# Patient Record
Sex: Female | Born: 1980
Health system: Southern US, Community
[De-identification: ages and names within clinical notes are randomized; demographics above are authoritative.]

## PROBLEM LIST (undated history)

## (undated) DIAGNOSIS — T783XXA Angioneurotic edema, initial encounter: Secondary | ICD-10-CM

## (undated) DIAGNOSIS — Z973 Presence of spectacles and contact lenses: Secondary | ICD-10-CM

## (undated) DIAGNOSIS — I10 Essential (primary) hypertension: Secondary | ICD-10-CM

## (undated) DIAGNOSIS — D573 Sickle-cell trait: Secondary | ICD-10-CM

## (undated) DIAGNOSIS — J329 Chronic sinusitis, unspecified: Secondary | ICD-10-CM

## (undated) DIAGNOSIS — J302 Other seasonal allergic rhinitis: Secondary | ICD-10-CM

## (undated) DIAGNOSIS — G8929 Other chronic pain: Secondary | ICD-10-CM

## (undated) DIAGNOSIS — N83201 Unspecified ovarian cyst, right side: Secondary | ICD-10-CM

## (undated) DIAGNOSIS — L509 Urticaria, unspecified: Secondary | ICD-10-CM

## (undated) HISTORY — DX: Chronic sinusitis, unspecified: J32.9

## (undated) HISTORY — DX: Angioneurotic edema, initial encounter: T78.3XXA

## (undated) HISTORY — DX: Urticaria, unspecified: L50.9

## (undated) HISTORY — PX: TONSILLECTOMY: SUR1361

---
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
## 2002-03-15 ENCOUNTER — Emergency Department (HOSPITAL_COMMUNITY): Admission: EM | Admit: 2002-03-15 | Discharge: 2002-03-15 | Payer: Self-pay | Admitting: Emergency Medicine

## 2012-10-31 ENCOUNTER — Emergency Department (HOSPITAL_BASED_OUTPATIENT_CLINIC_OR_DEPARTMENT_OTHER)
Admission: EM | Admit: 2012-10-31 | Discharge: 2012-10-31 | Disposition: A | Discharge status: Home / Self Care | Payer: No Typology Code available for payment source | Attending: Emergency Medicine | Admitting: Emergency Medicine

## 2012-10-31 ENCOUNTER — Encounter (HOSPITAL_BASED_OUTPATIENT_CLINIC_OR_DEPARTMENT_OTHER): Payer: Self-pay | Admitting: Emergency Medicine

## 2012-10-31 ENCOUNTER — Emergency Department (HOSPITAL_BASED_OUTPATIENT_CLINIC_OR_DEPARTMENT_OTHER): Payer: No Typology Code available for payment source

## 2012-10-31 DIAGNOSIS — S8990XA Unspecified injury of unspecified lower leg, initial encounter: Secondary | ICD-10-CM | POA: Insufficient documentation

## 2012-10-31 DIAGNOSIS — S79919A Unspecified injury of unspecified hip, initial encounter: Secondary | ICD-10-CM | POA: Insufficient documentation

## 2012-10-31 DIAGNOSIS — I10 Essential (primary) hypertension: Secondary | ICD-10-CM | POA: Insufficient documentation

## 2012-10-31 DIAGNOSIS — Y92410 Unspecified street and highway as the place of occurrence of the external cause: Secondary | ICD-10-CM | POA: Insufficient documentation

## 2012-10-31 DIAGNOSIS — Y9389 Activity, other specified: Secondary | ICD-10-CM | POA: Insufficient documentation

## 2012-10-31 DIAGNOSIS — IMO0002 Reserved for concepts with insufficient information to code with codable children: Secondary | ICD-10-CM | POA: Insufficient documentation

## 2012-10-31 DIAGNOSIS — Z79899 Other long term (current) drug therapy: Secondary | ICD-10-CM | POA: Insufficient documentation

## 2012-10-31 DIAGNOSIS — S0990XA Unspecified injury of head, initial encounter: Secondary | ICD-10-CM | POA: Insufficient documentation

## 2012-10-31 HISTORY — DX: Essential (primary) hypertension: I10

## 2012-10-31 LAB — PREGNANCY, URINE: Preg Test, Ur: NEGATIVE

## 2012-10-31 MED ORDER — HYDROCODONE-ACETAMINOPHEN 5-325 MG PO TABS: 1.0000 | ORAL_TABLET | Freq: Four times a day (QID) | ORAL | Status: DC | PRN

## 2012-10-31 MED ORDER — IBUPROFEN 800 MG PO TABS: 800.0000 mg | ORAL_TABLET | Freq: Three times a day (TID) | ORAL | Status: DC | PRN

## 2012-10-31 MED ORDER — HYDROCODONE-ACETAMINOPHEN 5-325 MG PO TABS
2.0000 | ORAL_TABLET | Freq: Once | ORAL | Status: AC
  Administered 2012-10-31: 2 via ORAL
  Filled 2012-10-31: qty 2

## 2012-10-31 MED ORDER — IBUPROFEN 800 MG PO TABS
800.0000 mg | ORAL_TABLET | Freq: Once | ORAL | Status: AC
  Administered 2012-10-31: 800 mg via ORAL
  Filled 2012-10-31: qty 1
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
## 2012-10-31 NOTE — ED Notes (Signed)
IV attempted x2- unsuccessful. 

## 2016-10-15 ENCOUNTER — Emergency Department (HOSPITAL_BASED_OUTPATIENT_CLINIC_OR_DEPARTMENT_OTHER)
Admission: EM | Admit: 2016-10-15 | Discharge: 2016-10-15 | Disposition: A | Discharge status: Home / Self Care | Payer: BC Managed Care – PPO | Attending: Emergency Medicine | Admitting: Emergency Medicine

## 2016-10-15 ENCOUNTER — Encounter (HOSPITAL_BASED_OUTPATIENT_CLINIC_OR_DEPARTMENT_OTHER): Payer: Self-pay | Admitting: *Deleted

## 2016-10-15 ENCOUNTER — Emergency Department (HOSPITAL_BASED_OUTPATIENT_CLINIC_OR_DEPARTMENT_OTHER): Payer: BC Managed Care – PPO

## 2016-10-15 DIAGNOSIS — I10 Essential (primary) hypertension: Secondary | ICD-10-CM | POA: Diagnosis not present

## 2016-10-15 DIAGNOSIS — N83202 Unspecified ovarian cyst, left side: Secondary | ICD-10-CM | POA: Insufficient documentation

## 2016-10-15 DIAGNOSIS — R109 Unspecified abdominal pain: Secondary | ICD-10-CM | POA: Diagnosis present

## 2016-10-15 DIAGNOSIS — N83201 Unspecified ovarian cyst, right side: Secondary | ICD-10-CM

## 2016-10-15 DIAGNOSIS — R112 Nausea with vomiting, unspecified: Secondary | ICD-10-CM | POA: Diagnosis not present

## 2016-10-15 DIAGNOSIS — K529 Noninfective gastroenteritis and colitis, unspecified: Secondary | ICD-10-CM | POA: Diagnosis not present

## 2016-10-15 DIAGNOSIS — Z79899 Other long term (current) drug therapy: Secondary | ICD-10-CM | POA: Diagnosis not present

## 2016-10-15 LAB — COMPREHENSIVE METABOLIC PANEL
ALT: 17 U/L (ref 14–54)
AST: 21 U/L (ref 15–41)
Albumin: 3.8 g/dL (ref 3.5–5.0)
Alkaline Phosphatase: 64 U/L (ref 38–126)
Anion gap: 8 (ref 5–15)
BUN: 10 mg/dL (ref 6–20)
CO2: 22 mmol/L (ref 22–32)
Calcium: 8.6 mg/dL — ABNORMAL LOW (ref 8.9–10.3)
Chloride: 106 mmol/L (ref 101–111)
Creatinine, Ser: 0.6 mg/dL (ref 0.44–1.00)
GFR calc Af Amer: >60 mL/min (ref >60)
Glucose, Bld: 115 mg/dL — ABNORMAL HIGH (ref 65–99)
Potassium: 3.3 mmol/L — ABNORMAL LOW (ref 3.5–5.1)
Sodium: 136 mmol/L (ref 135–145)
TOTAL PROTEIN: 7.3 g/dL (ref 6.5–8.1)
Total Bilirubin: 0.5 mg/dL (ref 0.3–1.2)

## 2016-10-15 LAB — CBC WITH DIFFERENTIAL/PLATELET
Basophils Absolute: 0 10*3/uL (ref 0.0–0.1)
Basophils Relative: 0 %
Eosinophils Absolute: 0 10*3/uL (ref 0.0–0.7)
Eosinophils Relative: 0 %
HCT: 35.3 % — ABNORMAL LOW (ref 36.0–46.0)
Hemoglobin: 11.9 g/dL — ABNORMAL LOW (ref 12.0–15.0)
Lymphocytes Relative: 4 %
Lymphs Abs: 0.6 10*3/uL — ABNORMAL LOW (ref 0.7–4.0)
MCH: 27.9 pg (ref 26.0–34.0)
MCHC: 33.7 g/dL (ref 30.0–36.0)
MCV: 82.7 fL (ref 78.0–100.0)
MONOS PCT: 4 %
Monocytes Absolute: 0.6 10*3/uL (ref 0.1–1.0)
Neutro Abs: 15 10*3/uL — ABNORMAL HIGH (ref 1.7–7.7)
Neutrophils Relative %: 92 %
PLATELETS: 293 10*3/uL (ref 150–400)
RBC: 4.27 MIL/uL (ref 3.87–5.11)
RDW: 13.5 % (ref 11.5–15.5)
WBC: 16.2 10*3/uL — ABNORMAL HIGH (ref 4.0–10.5)

## 2016-10-15 LAB — PREGNANCY, URINE: Preg Test, Ur: NEGATIVE

## 2016-10-15 LAB — WET PREP, GENITAL
CLUE CELLS WET PREP: NONE SEEN
SPERM: NONE SEEN
TRICH WET PREP: NONE SEEN
Yeast Wet Prep HPF POC: NONE SEEN

## 2016-10-15 LAB — URINALYSIS, ROUTINE W REFLEX MICROSCOPIC
BILIRUBIN URINE: NEGATIVE
Glucose, UA: NEGATIVE mg/dL
HGB URINE DIPSTICK: NEGATIVE
Ketones, ur: >80 mg/dL — AB
Leukocytes, UA: NEGATIVE
Nitrite: NEGATIVE
PROTEIN: NEGATIVE mg/dL
SPECIFIC GRAVITY, URINE: 1.01 (ref 1.005–1.030)
pH: 8 (ref 5.0–8.0)

## 2016-10-15 LAB — I-STAT CG4 LACTIC ACID, ED: LACTIC ACID, VENOUS: 1.05 mmol/L (ref 0.5–1.9)

## 2016-10-15 LAB — LIPASE, BLOOD: Lipase: 26 U/L (ref 11–51)

## 2016-10-15 MED ORDER — DICYCLOMINE HCL 10 MG/ML IM SOLN
20.0000 mg | Freq: Once | INTRAMUSCULAR | Status: AC
  Administered 2016-10-15: 20 mg via INTRAMUSCULAR
  Filled 2016-10-15: qty 2

## 2016-10-15 MED ORDER — PROMETHAZINE HCL 25 MG/ML IJ SOLN
25.0000 mg | Freq: Once | INTRAMUSCULAR | Status: AC
  Administered 2016-10-15: 25 mg via INTRAVENOUS
  Filled 2016-10-15: qty 1

## 2016-10-15 MED ORDER — HYDROMORPHONE HCL 1 MG/ML IJ SOLN
0.5000 mg | Freq: Once | INTRAMUSCULAR | Status: AC
  Administered 2016-10-15: 0.5 mg via INTRAVENOUS
  Filled 2016-10-15: qty 1

## 2016-10-15 MED ORDER — ONDANSETRON HCL 4 MG/2ML IJ SOLN
4.0000 mg | INTRAMUSCULAR | Status: AC
  Administered 2016-10-15: 4 mg via INTRAVENOUS
  Filled 2016-10-15: qty 2

## 2016-10-15 MED ORDER — FAMOTIDINE IN NACL 20-0.9 MG/50ML-% IV SOLN
20.0000 mg | Freq: Once | INTRAVENOUS | Status: AC
  Administered 2016-10-15: 20 mg via INTRAVENOUS
  Filled 2016-10-15: qty 50

## 2016-10-15 MED ORDER — ONDANSETRON HCL 4 MG/2ML IJ SOLN
4.0000 mg | Freq: Once | INTRAMUSCULAR | Status: AC
  Administered 2016-10-15: 4 mg via INTRAVENOUS
  Filled 2016-10-15: qty 2

## 2016-10-15 MED ORDER — SODIUM CHLORIDE 0.9 % IV BOLUS (SEPSIS)
1000.0000 mL | Freq: Once | INTRAVENOUS | Status: AC
  Administered 2016-10-15: 1000 mL via INTRAVENOUS

## 2016-10-15 MED ORDER — IOPAMIDOL (ISOVUE-300) INJECTION 61%
100.0000 mL | Freq: Once | INTRAVENOUS | Status: AC | PRN
  Administered 2016-10-15: 100 mL via INTRAVENOUS

## 2016-10-15 MED ORDER — HYDROCODONE-ACETAMINOPHEN 5-325 MG PO TABS: ORAL_TABLET | ORAL | 0 refills | Status: DC

## 2016-10-15 MED ORDER — ONDANSETRON HCL 4 MG PO TABS: 4.0000 mg | ORAL_TABLET | Freq: Three times a day (TID) | ORAL | 0 refills | Status: DC | PRN

## 2016-10-15 MED FILL — ONDANSETRON HCL 4 MG TABLET: 4 | 4 days supply | Qty: 10 | Fill #0

## 2016-10-15 MED FILL — HYDROCODON-APAP 5-325: 5-325 | 2 days supply | Qty: 13 | Fill #0

## 2016-10-15 NOTE — Discharge Instructions (Signed)
Take vicodin for breakthrough pain, do not drink alcohol, drive, care for children or do other critical tasks while taking vicodin.  Drink clear liquids over the course the next 2 days.  Please follow with your primary care doctor in the next 2 days for a check-up. They must obtain records for further management.   Do not hesitate to return to the Emergency Department for any new, worsening or concerning symptoms.

## 2016-10-15 NOTE — ED Notes (Signed)
PA at bedside.

## 2016-10-15 NOTE — ED Notes (Signed)
Pt c/o increased abdominal pressure. PA made aware. PA in to see patient at this time.

## 2016-10-15 NOTE — ED Triage Notes (Signed)
Pt reports n/v/d since 0300. Reports seeing blood in diarrhea in last 2 episodes

## 2016-10-15 NOTE — ED Notes (Signed)
Pt to CT

## 2016-10-15 NOTE — ED Notes (Signed)
IV attempt x 1 by this RN. Brett Canales, RT to attempt at this time.

## 2016-10-15 NOTE — ED Notes (Signed)
Pt c/o increased nausea at this time. PA made aware.

## 2016-10-15 NOTE — ED Provider Notes (Signed)
[facility]   CSN: [id number] Arrival date & time: 10/15/16  1039     History   Chief Complaint Chief Complaint  Patient presents with  . Abdominal Pain    HPI  Blood pressure 113/64, pulse (!) 102, temperature 98.2 F (36.8 C), temperature source Axillary, resp. rate 20, height  (1.6 m), weight 70.3 kg (155 lb), last menstrual period 09/20/2016, SpO2 100 %.  Carol Booth is a 36 y.o. female complaining of severe periumbilical abdominal pain onset at 3 AM it woke her from sleep she felt like she had to go to the bathroom and when she went to the restroom she vomited immediately. She then proceeded to have multiple episodes of nonbloody, nonbilious, non-coffee ground emesis with associated watery diarrhea, she states that towards the end the diarrhea became blood streaked and that frightened her. She doesn't have any sick contacts but she does work as a Midwife. She denies dysuria or hematuria abnormal vaginal discharge. She states the pain is 6 out of 10 and is suprapubic.  Past Medical History:  Diagnosis Date  . Hypertension     There are no active problems to display for this patient.   Past Surgical History:  Procedure Laterality Date  . TONSILLECTOMY      OB History    No data available       Home Medications    Prior to Admission medications   Medication Sig Start Date End Date Taking? Authorizing Provider  hydrochlorothiazide (HYDRODIURIL) 25 MG tablet Take 25 mg by mouth daily.   Yes [provider]  HYDROcodone-acetaminophen (NORCO/VICODIN) 5-325 MG tablet Take 1-2 tablets by mouth every 6 hours as needed for pain. 10/15/16   Davinci Glotfelty, Joni Reining, PA-C  ibuprofen (ADVIL,MOTRIN) 800 MG tablet Take 1 tablet (800 mg total) by mouth every 8 (eight) hours as needed for pain. 10/31/12   Elwin Mocha, MD  ondansetron (ZOFRAN) 4 MG tablet Take 1 tablet (4 mg total) by mouth every 8 (eight) hours as needed for  nausea or vomiting. 10/15/16   Samanth Mirkin, Mardella Layman    Family History No family history on file.  Social History Social History  Substance Use Topics  . Smoking status: Never Smoker  . Smokeless tobacco: Never Used  . Alcohol use Yes     Comment: social     Allergies   Septra [sulfamethoxazole-trimethoprim]   Review of Systems Review of Systems  A complete review of systems was obtained and all systems are negative except as noted in the HPI and PMH.    Physical Exam Updated Vital Signs BP 115/74 (BP Location: Right Arm)   Pulse 94   Temp 98.5 F (36.9 C) (Oral)   Resp 18   Ht  (1.6 m)   Wt 70.3 kg (155 lb)   LMP 09/20/2016   SpO2 97%   BMI 27.46 kg/m   Physical Exam  Constitutional: She is oriented to person, place, and time. She appears well-developed and well-nourished. No distress.  HENT:  Head: Normocephalic.  Mouth/Throat: Oropharynx is clear and moist.  Eyes: Conjunctivae and EOM are normal.  Neck: Normal range of motion.  Cardiovascular: Normal rate.   Pulmonary/Chest: Effort normal. No stridor.  Abdominal: Soft. Bowel sounds are normal. She exhibits no distension and no mass. There is no tenderness. There is no rebound and no guarding.  Mild suprapubic tenderness with no guarding or rebound, Rovsing negative, so is negative, obturator negative.  Genitourinary:  Genitourinary Comments: No  CVA tenderness to percussion bilaterally  Pelvic exam a chaperoned by technician: No rashes or lesions, no abnormal vaginal discharge, no cervical motion or adnexal tenderness.  Musculoskeletal: Normal range of motion.  Neurological: She is alert and oriented to person, place, and time.  Psychiatric: She has a normal mood and affect.  Nursing note and vitals reviewed.    ED Treatments / Results  Labs (all labs ordered are listed, but only abnormal results are displayed) Labs Reviewed  WET PREP, GENITAL - Abnormal; Notable for the following:        Result Value   WBC, Wet Prep HPF POC MODERATE (*)    All other components within normal limits  CBC WITH DIFFERENTIAL/PLATELET - Abnormal; Notable for the following:    WBC 16.2 (*)    Hemoglobin 11.9 (*)    HCT 35.3 (*)    Neutro Abs 15.0 (*)    Lymphs Abs 0.6 (*)    All other components within normal limits  URINALYSIS, ROUTINE W REFLEX MICROSCOPIC - Abnormal; Notable for the following:    Ketones, ur >80 (*)    All other components within normal limits  COMPREHENSIVE METABOLIC PANEL - Abnormal; Notable for the following:    Potassium 3.3 (*)    Glucose, Bld 115 (*)    Calcium 8.6 (*)    All other components within normal limits  PREGNANCY, URINE  LIPASE, BLOOD  I-STAT CG4 LACTIC ACID, ED  GC/CHLAMYDIA PROBE AMP ([place]) NOT AT [facility] ([place])    EKG  EKG Interpretation None       Radiology Ct Abdomen Pelvis W Contrast  Result Date: 10/15/2016 CLINICAL DATA:  Initial evaluation for acute nausea, vomiting, diarrhea. EXAM: CT ABDOMEN AND PELVIS WITH CONTRAST TECHNIQUE: Multidetector CT imaging of the abdomen and pelvis was performed using the standard protocol following bolus administration of intravenous contrast. CONTRAST:  ISOVUE-300 IOPAMIDOL (ISOVUE-300) INJECTION 61% COMPARISON:  Prior CT from 10/31/2012. FINDINGS: Lower chest: Minimal subsegmental atelectasis present within the visualized lung bases. Visualized lungs are otherwise clear. Hepatobiliary: Liver demonstrates a normal contrast enhanced appearance. Gallbladder within normal limits. No biliary dilatation. Pancreas: Pancreas within normal limits. Spleen: Spleen within normal limits. Adrenals/Urinary Tract: Adrenal glands are normal. Kidneys equal in size with symmetric enhancement. No nephrolithiasis, hydronephrosis, or focal enhancing renal mass. No hydroureter. Bladder within normal limits. Stomach/Bowel: Stomach within normal limits. No evidence for bowel obstruction. Appendix normal. Mild wall thickening with  hazy inflammatory stranding about the partially distended descending and sigmoid colon, suggesting acute colitis. Intraluminal fluid density within the distal colon consistent with concomitant diarrheal illness. No other acute inflammatory changes seen about the bowels. Vascular/Lymphatic: Normal intravascular enhancement seen throughout the intra-abdominal aorta and its branch vessels. 2.3 x 4.2 cm hypodense lesion seen at the left anterolateral [place] of the distal esophagus just above the GE junction (series 2, image 8). Additional 2.0 x 1.3 cm hypodense lesion seen at the right posterolateral aspect of the esophagus as well. These lesions may be contiguous with 1 another longer anterior aspect. Findings are indeterminate, but new relative to prior CT. No other pathologically enlarged lymph nodes identified within the abdomen and pelvis. Reproductive: Uterus within normal limits. 3.5 x 2.3 cm cystic lesion positioned near the left ovary, suspected to reflect a hemorrhagic cyst (series 2, image 68). Additional 4.6 cm right adnexal hypo dense lesion also suspected to reflect a hemorrhagic cyst as well. Subtle internal fluid fluid level may be present. Other: No free air or fluid. Small fat  containing paraumbilical hernia noted. Musculoskeletal: No acute osseus abnormality. No worrisome lytic or blastic osseous lesions. IMPRESSION: 1. Mild wall thickening with hazy soft tissue stranding about the descending/sigmoid colon, suggesting acute colitis. No complication identified. 2. Two cystic lesions measuring up to 4.2 cm adjacent to the GE junction, possibly contiguous with one another. Findings are indeterminate, but new relative to prior CT from 2014. Considerations include enlarged cystic paraesophageal lymph nodes, although no other adenopathy identified within the abdomen and pelvis. Possible fluid-filled esophageal diverticulum or duplication cyst could also be considered. Correlation with upper GI to evaluate  for communication with the distal esophageal lumen may be helpful for further evaluation. 3. Bilateral adnexal cysts measuring up to 4.6 cm as above, indeterminate, but suspected to reflect hemorrhagic ovarian cysts. A follow-up in ultrasound in 6-12 weeks recommended to ensure resolution. Electronically Signed   By: Rise Mu M.D.   On: 10/15/2016 15:28    Procedures Procedures (including critical care time)  Medications Ordered in ED Medications  ondansetron (ZOFRAN) injection 4 mg (4 mg Intravenous Given 10/15/16 1147)  sodium chloride 0.9 % bolus 1,000 mL (0 mLs Intravenous Stopped 10/15/16 1241)  famotidine (PEPCID) IVPB 20 mg premix (0 mg Intravenous Stopped 10/15/16 1316)  dicyclomine (BENTYL) injection 20 mg (20 mg Intramuscular Given 10/15/16 1241)  promethazine (PHENERGAN) injection 25 mg (25 mg Intravenous Given 10/15/16 1323)  sodium chloride 0.9 % bolus 1,000 mL (0 mLs Intravenous Stopped 10/15/16 1458)  HYDROmorphone (DILAUDID) injection 0.5 mg (0.5 mg Intravenous Given 10/15/16 1351)  ondansetron (ZOFRAN) injection 4 mg (4 mg Intravenous Given 10/15/16 1351)  iopamidol (ISOVUE-300) 61 % injection 100 mL (100 mLs Intravenous Contrast Given 10/15/16 1451)     Initial Impression / Assessment and Plan / ED Course  I have reviewed the triage vital signs and the nursing notes.  Pertinent labs & imaging results that were available during my care of the patient were reviewed by me and considered in my medical decision making (see chart for details).     Vitals:   10/15/16 1111 10/15/16 1323 10/15/16 1406 10/15/16 1406  BP:  108/60 115/74   Pulse:  84 94   Resp:  (!) 22 18   Temp:   98.7 F (37.1 C) 98.5 F (36.9 C)  TempSrc:   Oral Oral  SpO2:  100% 97%   Weight: 70.3 kg (155 lb)     Height:  (1.6 m)       Medications  ondansetron (ZOFRAN) injection 4 mg (4 mg Intravenous Given 10/15/16 1147)  sodium chloride 0.9 % bolus 1,000 mL (0 mLs Intravenous Stopped  10/15/16 1241)  famotidine (PEPCID) IVPB 20 mg premix (0 mg Intravenous Stopped 10/15/16 1316)  dicyclomine (BENTYL) injection 20 mg (20 mg Intramuscular Given 10/15/16 1241)  promethazine (PHENERGAN) injection 25 mg (25 mg Intravenous Given 10/15/16 1323)  sodium chloride 0.9 % bolus 1,000 mL (0 mLs Intravenous Stopped 10/15/16 1458)  HYDROmorphone (DILAUDID) injection 0.5 mg (0.5 mg Intravenous Given 10/15/16 1351)  ondansetron (ZOFRAN) injection 4 mg (4 mg Intravenous Given 10/15/16 1351)  iopamidol (ISOVUE-300) 61 % injection 100 mL (100 mLs Intravenous Contrast Given 10/15/16 1451)    Nimrat Heckert is 36 y.o. female presenting with Acute onset of abdominal pain, nausea and vomiting. The diarrhea became blood-streaked and that frightened her. Patient with a benign abdominal exam, she does have some mild suprapubic tenderness. Will check basic blood work, hydrate and give Zofran in addition to Bentyl and Pepcid.  Patient with persistent  abdominal pain, given her leukocytosis of 16 will obtain CT abdomen pelvis, pelvic exam unremarkable. No signs of PID.  CT abdomen pelvis Reveals colitis with bilateral ovarian cysts, likely hemorrhagic (she is not anemic) they also note 2 cystic lesions adjacent to the GE junction they are new from a prior CT in 2014. This could be enlarged cystic paraesophageal lymph nodes there is no other lymphadenopathy or a possible esophageal diverticulum. Advised patient finding in that she will need to follow closely with both primary care and gastroenterology. Patient verbalized understanding. Advised her to drink clears and we've had an extensive discussion of return precautions.  Discussed case with attending physician who agrees with care plan and disposition.   Evaluation does not show pathology that would require ongoing emergent intervention or inpatient treatment. Pt is hemodynamically stable and mentating appropriately. Discussed findings and plan with  patient/guardian, who agrees with care plan. All questions answered. Return precautions discussed and outpatient follow up given.      Final Clinical Impressions(s) / ED Diagnoses   Final diagnoses:  Colitis  Bilateral ovarian cysts    New Prescriptions New Prescriptions   HYDROCODONE-ACETAMINOPHEN (NORCO/VICODIN) 5-325 MG TABLET    Take 1-2 tablets by mouth every 6 hours as needed for pain.   ONDANSETRON (ZOFRAN) 4 MG TABLET    Take 1 tablet (4 mg total) by mouth every 8 (eight) hours as needed for nausea or vomiting.     Kaylyn Lim 10/15/16 1604    Vanetta Mulders, MD 10/16/16 [phone]

## 2016-10-16 LAB — GC/CHLAMYDIA PROBE AMP (~~LOC~~) NOT AT ARMC
Chlamydia: NEGATIVE
Neisseria Gonorrhea: NEGATIVE

## 2016-11-27 ENCOUNTER — Encounter: Payer: Self-pay | Admitting: Gastroenterology

## 2017-01-26 ENCOUNTER — Ambulatory Visit: Payer: BC Managed Care – PPO | Admitting: Gastroenterology

## 2017-02-03 ENCOUNTER — Ambulatory Visit: Payer: BC Managed Care – PPO | Admitting: Gastroenterology

## 2018-09-04 IMAGING — CT CT ABD-PELV W/ CM
2 of 4 series · 15 of 46 positions shown, 17 images · IV contrast (APPLIED)
Comparison: Prior CT from 10/31/2012.

CLINICAL DATA: Initial evaluation for acute nausea, vomiting,
diarrhea.

EXAM:
CT ABDOMEN AND PELVIS WITH CONTRAST
TECHNIQUE: Multidetector CT imaging of the abdomen and pelvis was performed
using the standard protocol following bolus administration of
intravenous contrast.
CONTRAST:  100mL 05Q8W7-RKK IOPAMIDOL ([id number]-RKK) INJECTION 61%

[Series 2: axial st · axial · 0.96mm/px · z∈[-454,-50]mm · 12 of 97 slices shown, 14 images]
[im 8/97  soft-tissue]
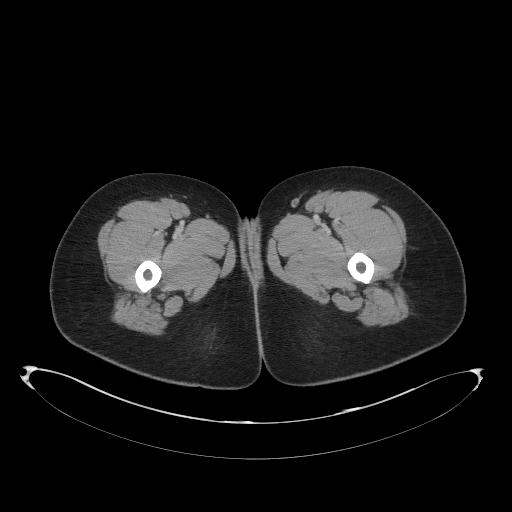
[im 8/97  bone]
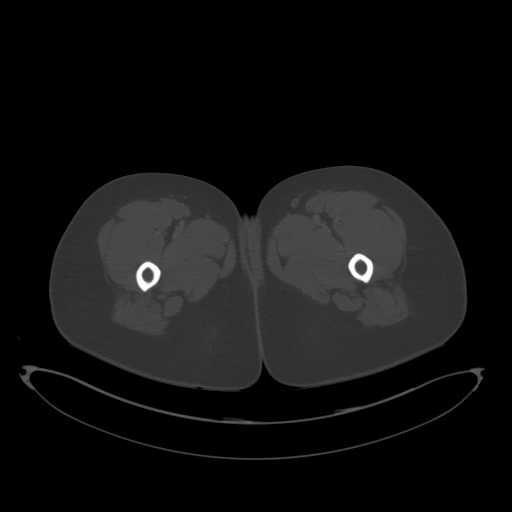
[im 15/97  soft-tissue]
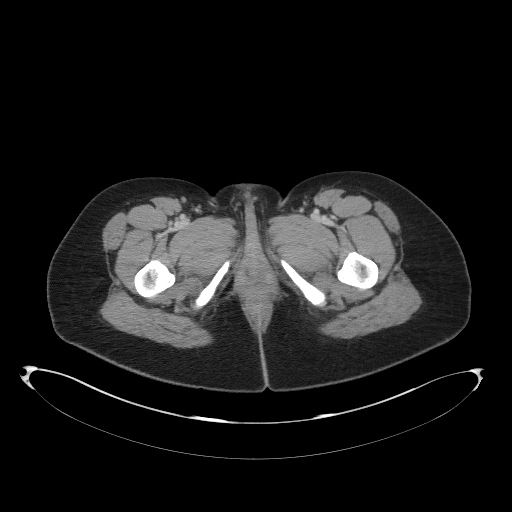
[im 23/97  soft-tissue]
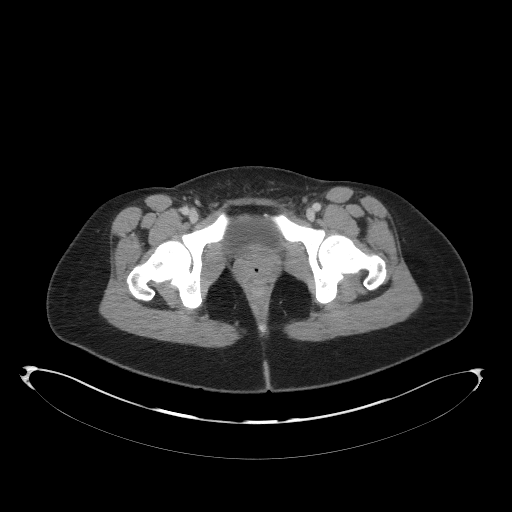
[im 30/97  soft-tissue]
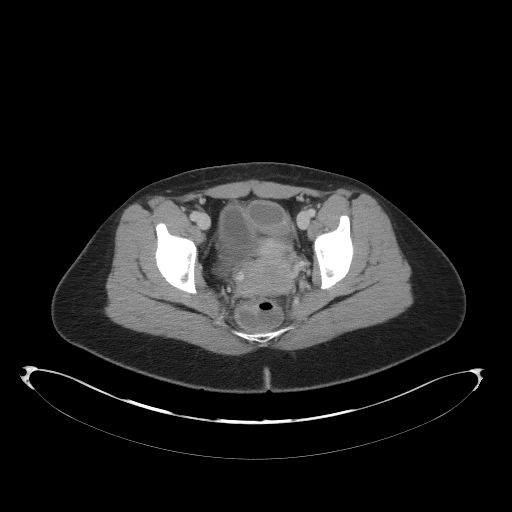
[im 37/97  soft-tissue]
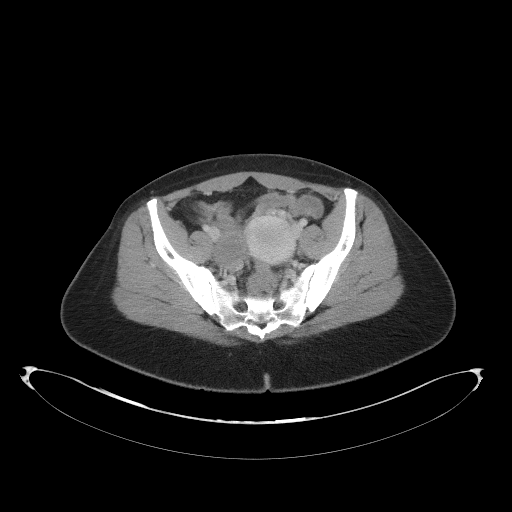
[im 45/97  soft-tissue]
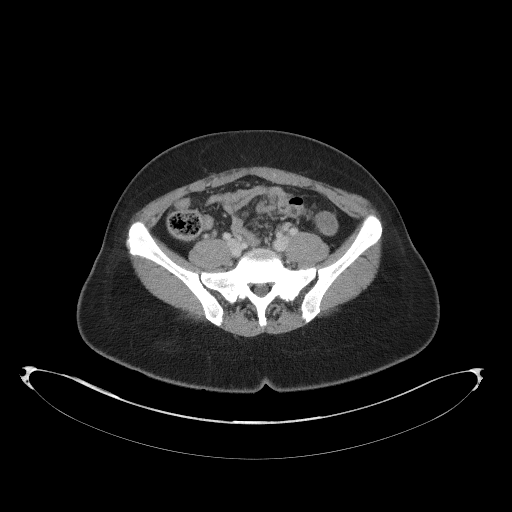
[im 52/97  soft-tissue]
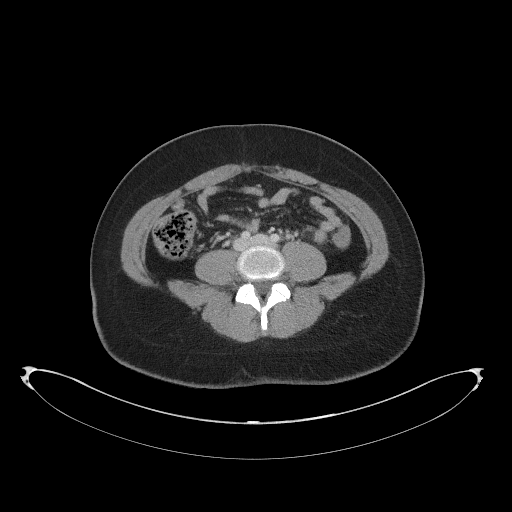
[im 60/97  soft-tissue]
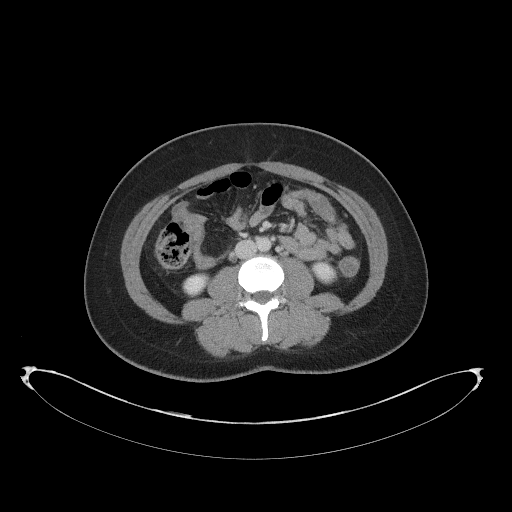
[im 67/97  soft-tissue]
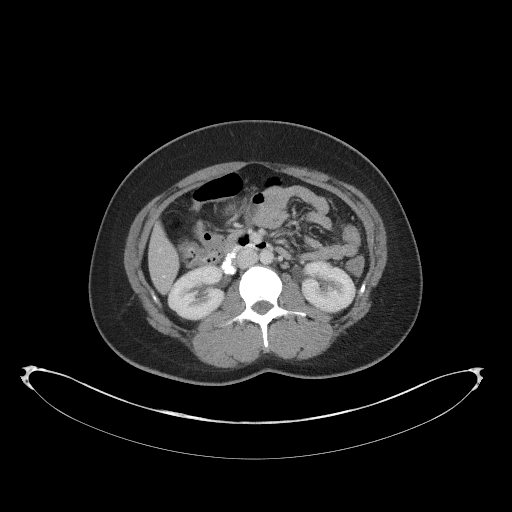
[im 67/97  bone]
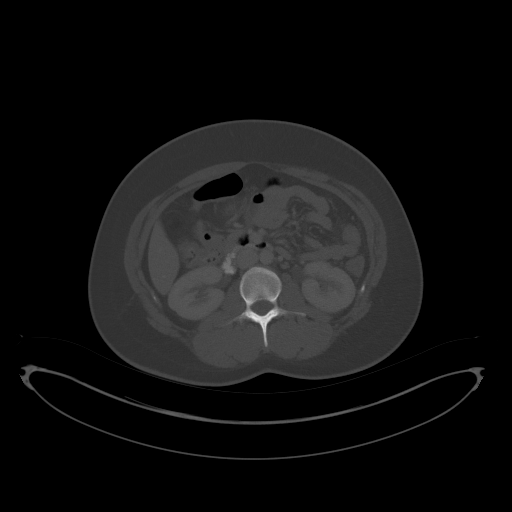
[im 74/97  soft-tissue]
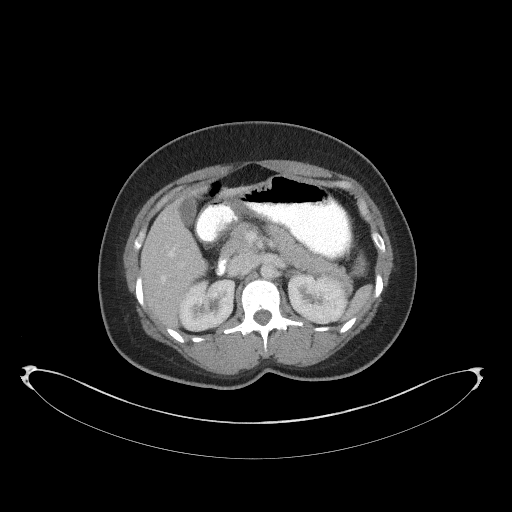
[im 82/97  soft-tissue]
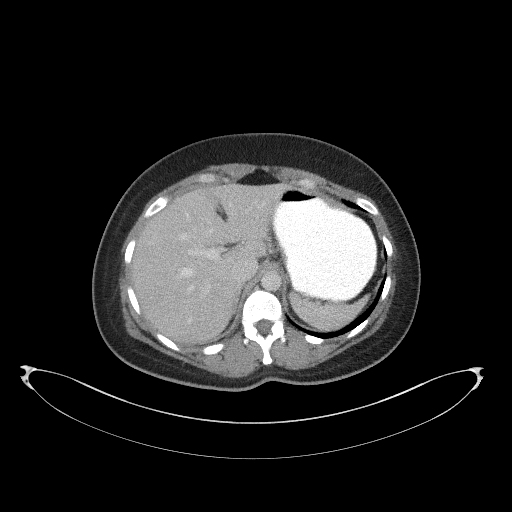
[im 89/97  soft-tissue]
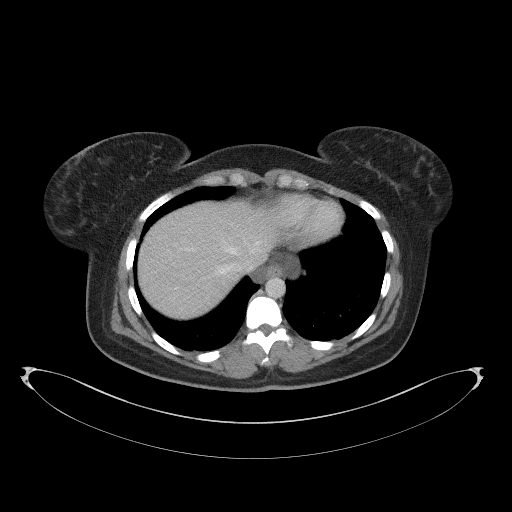

[Series 5: coronal st · coronal · 0.80mm/px · 3 of 90 slices shown]
[im 30/90  soft-tissue]
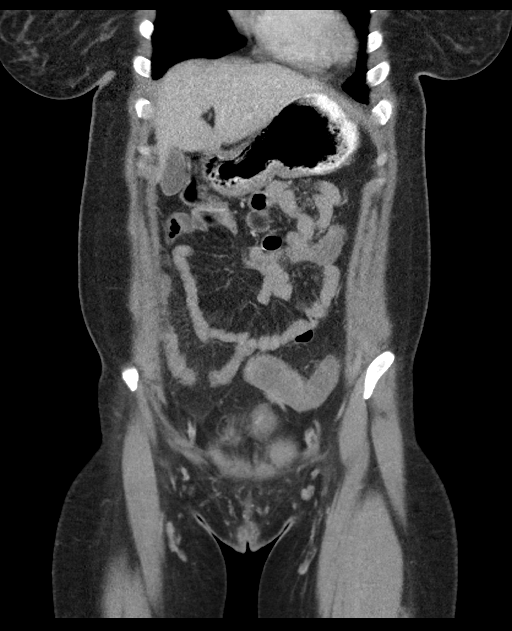
[im 40/90  soft-tissue]
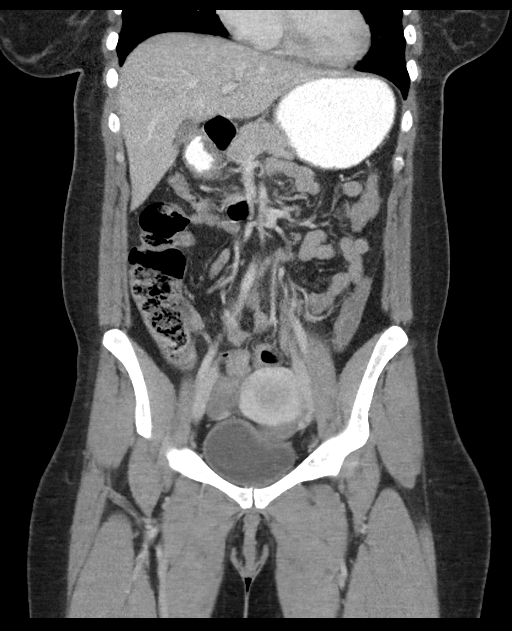
[im 50/90  soft-tissue]
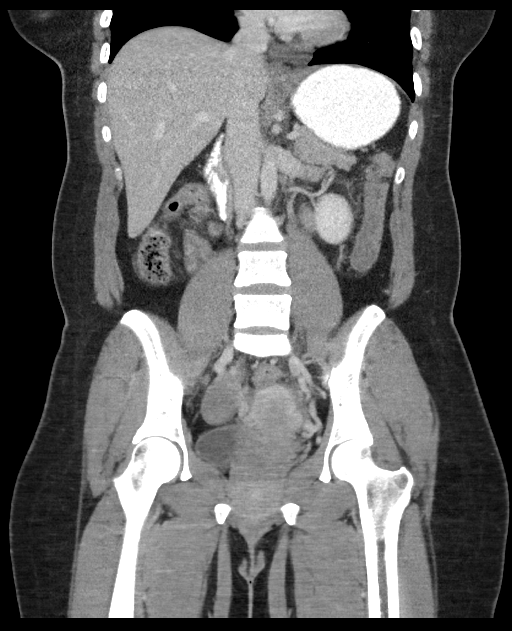

[15 of 46 positions shown; findings below may reference images not displayed]

FINDINGS: Lower chest: Minimal subsegmental atelectasis present within the
visualized lung bases. Visualized lungs are otherwise clear.

Hepatobiliary: Liver demonstrates a normal contrast enhanced
appearance. Gallbladder within normal limits. No biliary dilatation.

Pancreas: Pancreas within normal limits.

Spleen: Spleen within normal limits.

Adrenals/Urinary Tract: Adrenal glands are normal. Kidneys equal in
size with symmetric enhancement. No nephrolithiasis, hydronephrosis,
or focal enhancing renal mass. No hydroureter. Bladder within normal
limits.

Stomach/Bowel: Stomach within normal limits. No evidence for bowel
obstruction. Appendix normal. Mild wall thickening with hazy
inflammatory stranding about the partially distended descending and
sigmoid colon, suggesting acute colitis. Intraluminal fluid density
within the distal colon consistent with concomitant diarrheal
illness. No other acute inflammatory changes seen about the bowels.

Vascular/Lymphatic: Normal intravascular enhancement seen throughout
the intra-abdominal aorta and its branch vessels.

2.3 x 4.2 cm hypodense lesion seen at the left anterolateral
[place] of the distal esophagus just above the GE junction (series
2, image 8). Additional 2.0 x 1.3 cm hypodense lesion seen at the
right posterolateral aspect of the esophagus as well. These lesions
may be contiguous with 1 another longer anterior aspect. Findings
are indeterminate, but new relative to prior CT.

No other pathologically enlarged lymph nodes identified within the
abdomen and pelvis.

Reproductive: Uterus within normal limits. 3.5 x 2.3 cm cystic
lesion positioned near the left ovary, suspected to reflect a
hemorrhagic cyst (series 2, image 68). Additional 4.6 cm right
adnexal hypo dense lesion also suspected to reflect a hemorrhagic
cyst as well. Subtle internal fluid fluid level may be present.

Other: No free air or fluid. Small fat containing paraumbilical
hernia noted.

Musculoskeletal: No acute osseus abnormality. No worrisome lytic or
blastic osseous lesions.
IMPRESSION: 1. Mild wall thickening with hazy soft tissue stranding about the
descending/sigmoid colon, suggesting acute colitis. No complication
identified.
2. Two cystic lesions measuring up to 4.2 cm adjacent to the GE
junction, possibly contiguous with one another. Findings are
indeterminate, but new relative to prior CT from 0775.
Considerations include enlarged cystic paraesophageal lymph nodes,
although no other adenopathy identified within the abdomen and
pelvis. Possible fluid-filled esophageal diverticulum or duplication
cyst could also be considered. Correlation with upper GI to evaluate
for communication with the distal esophageal lumen may be helpful
for further evaluation.
3. Bilateral adnexal cysts measuring up to 4.6 cm as above,
indeterminate, but suspected to reflect hemorrhagic ovarian cysts. A
follow-up in ultrasound in 6-12 weeks recommended to ensure
resolution.

## 2019-01-17 ENCOUNTER — Ambulatory Visit: Payer: BC Managed Care – PPO | Attending: Internal Medicine

## 2019-01-17 DIAGNOSIS — Z20822 Contact with and (suspected) exposure to covid-19: Secondary | ICD-10-CM

## 2019-01-19 LAB — NOVEL CORONAVIRUS, NAA: SARS-CoV-2, NAA: NOT DETECTED

## 2020-02-07 DIAGNOSIS — Z8616 Personal history of COVID-19: Secondary | ICD-10-CM

## 2020-02-07 HISTORY — DX: Personal history of COVID-19: Z86.16

## 2020-02-16 ENCOUNTER — Other Ambulatory Visit: Payer: Self-pay

## 2020-02-16 ENCOUNTER — Emergency Department (HOSPITAL_COMMUNITY)
Admission: EM | Admit: 2020-02-16 | Discharge: 2020-02-16 | Disposition: A | Discharge status: Home / Self Care | Payer: BC Managed Care – PPO | Attending: Emergency Medicine | Admitting: Emergency Medicine

## 2020-02-16 DIAGNOSIS — Z8616 Personal history of COVID-19: Secondary | ICD-10-CM | POA: Diagnosis not present

## 2020-02-16 DIAGNOSIS — I10 Essential (primary) hypertension: Secondary | ICD-10-CM | POA: Insufficient documentation

## 2020-02-16 DIAGNOSIS — R220 Localized swelling, mass and lump, head: Secondary | ICD-10-CM | POA: Diagnosis present

## 2020-02-16 DIAGNOSIS — Z79899 Other long term (current) drug therapy: Secondary | ICD-10-CM | POA: Diagnosis not present

## 2020-02-16 DIAGNOSIS — J029 Acute pharyngitis, unspecified: Secondary | ICD-10-CM | POA: Diagnosis not present

## 2020-02-16 DIAGNOSIS — L509 Urticaria, unspecified: Secondary | ICD-10-CM | POA: Diagnosis not present

## 2020-02-16 DIAGNOSIS — K130 Diseases of lips: Secondary | ICD-10-CM | POA: Diagnosis not present

## 2020-02-16 LAB — BASIC METABOLIC PANEL
Anion gap: 12 (ref 5–15)
BUN: 10 mg/dL (ref 6–20)
CO2: 28 mmol/L (ref 22–32)
Calcium: 9.4 mg/dL (ref 8.9–10.3)
Chloride: 99 mmol/L (ref 98–111)
Creatinine, Ser: 0.91 mg/dL (ref 0.44–1.00)
GFR, Estimated: >60 mL/min (ref >60)
Glucose, Bld: 121 mg/dL — ABNORMAL HIGH (ref 70–99)
Potassium: 3.4 mmol/L — ABNORMAL LOW (ref 3.5–5.1)
Sodium: 139 mmol/L (ref 135–145)

## 2020-02-16 LAB — CBC WITH DIFFERENTIAL/PLATELET
Abs Immature Granulocytes: 0.02 10*3/uL (ref 0.00–0.07)
Basophils Absolute: 0 10*3/uL (ref 0.0–0.1)
Basophils Relative: 0 %
Eosinophils Absolute: 0.1 10*3/uL (ref 0.0–0.5)
Eosinophils Relative: 1 %
HCT: 36.3 % (ref 36.0–46.0)
Hemoglobin: 11.9 g/dL — ABNORMAL LOW (ref 12.0–15.0)
Immature Granulocytes: 0 %
Lymphocytes Relative: 28 %
Lymphs Abs: 2.5 10*3/uL (ref 0.7–4.0)
MCH: 26.7 pg (ref 26.0–34.0)
MCHC: 32.8 g/dL (ref 30.0–36.0)
MCV: 81.6 fL (ref 80.0–100.0)
Monocytes Absolute: 0.7 10*3/uL (ref 0.1–1.0)
Monocytes Relative: 8 %
Neutro Abs: 5.5 10*3/uL (ref 1.7–7.7)
Neutrophils Relative %: 63 %
Platelets: 430 10*3/uL — ABNORMAL HIGH (ref 150–400)
RBC: 4.45 MIL/uL (ref 3.87–5.11)
RDW: 15.6 % — ABNORMAL HIGH (ref 11.5–15.5)
WBC: 8.8 10*3/uL (ref 4.0–10.5)
nRBC: 0 % (ref 0.0–0.2)

## 2020-02-16 MED ORDER — PREDNISONE 20 MG PO TABS: 40.0000 mg | ORAL_TABLET | Freq: Every day | ORAL | 0 refills | Status: DC

## 2020-02-16 MED ORDER — EPINEPHRINE 0.3 MG/0.3ML IJ SOAJ: 0.3000 mg | Freq: Once | INTRAMUSCULAR | 1 refills | Status: AC | PRN

## 2020-02-16 MED ORDER — DEXAMETHASONE SODIUM PHOSPHATE 10 MG/ML IJ SOLN
10.0000 mg | Freq: Once | INTRAMUSCULAR | Status: AC
  Administered 2020-02-16: 10 mg via INTRAMUSCULAR
  Filled 2020-02-16: qty 1

## 2020-02-16 NOTE — ED Provider Notes (Signed)
[facility] Note   CSN: [id number] Arrival date & time: 02/16/20  1822     History Chief Complaint  Patient presents with  . Oral Swelling  . Sore Throat    Carol Booth is a 40 y.o. female.  Patient presents to the emergency department with a chief complaint of lip swelling.  She was diagnosed with Covid on 1/14.  She was given a 5-day course of prednisone and reports that she had some improvement with this.  She states that she continues to have intermittent lip swelling and hives.  She also states the last night it felt like she had some throat swelling.  She denies wheezing or vomiting.  Denies any new medications.  She takes HCTZ and birth control.  She denies any new food or allergic contacts.  She has been having the symptoms for about a week.  The history is provided by the patient. No language interpreter was used.       Past Medical History:  Diagnosis Date  . Hypertension     There are no problems to display for this patient.   Past Surgical History:  Procedure Laterality Date  . TONSILLECTOMY       OB History   No obstetric history on file.     No family history on file.  Social History   Tobacco Use  . Smoking status: Never Smoker  . Smokeless tobacco: Never Used  Vaping Use  . Vaping Use: Never used  Substance Use Topics  . Alcohol use: Yes    Comment: social  . Drug use: No    Home Medications Prior to Admission medications   Medication Sig Start Date End Date Taking? Authorizing Provider  hydrochlorothiazide (HYDRODIURIL) 25 MG tablet Take 25 mg by mouth daily.    [provider]  HYDROcodone-acetaminophen (NORCO/VICODIN) 5-325 MG tablet Take 1-2 tablets by mouth every 6 hours as needed for pain. 10/15/16   Pisciotta, Joni Reining, PA-C  ibuprofen (ADVIL,MOTRIN) 800 MG tablet Take 1 tablet (800 mg total) by mouth every 8 (eight) hours as needed for pain. 10/31/12   Elwin Mocha, MD   ondansetron (ZOFRAN) 4 MG tablet Take 1 tablet (4 mg total) by mouth every 8 (eight) hours as needed for nausea or vomiting. 10/15/16   Pisciotta, Joni Reining, PA-C    Allergies    Septra [sulfamethoxazole-trimethoprim]  Review of Systems   Review of Systems  All other systems reviewed and are negative.   Physical Exam Updated Vital Signs BP (!) 141/94   Pulse (!) 109   Temp 99.4 F (37.4 C)   Resp 18   Ht 5\' 3"  (1.6 m)   Wt 73 kg   LMP 01/14/2020 (Within Days)   SpO2 99%   BMI 28.52 kg/m   Physical Exam Vitals and nursing note reviewed.  Constitutional:      General: She is not in acute distress.    Appearance: She is well-developed and well-nourished.  HENT:     Head: Normocephalic and atraumatic.     Mouth/Throat:     Comments: Oropharynx is clear, no significant swelling, no stridor, normal phonation, mildly swollen lower lip Eyes:     Conjunctiva/sclera: Conjunctivae normal.  Cardiovascular:     Rate and Rhythm: Normal rate and regular rhythm.     Heart sounds: No murmur heard.   Pulmonary:     Effort: Pulmonary effort is normal. No respiratory distress.     Breath sounds: Normal breath sounds.  Comments: Lungs are clear to auscultation, no wheezing Abdominal:     Palpations: Abdomen is soft.     Tenderness: There is no abdominal tenderness.  Musculoskeletal:        General: No edema. Normal range of motion.     Cervical back: Neck supple.  Skin:    General: Skin is warm and dry.     Comments: Scattered mild hives  Neurological:     Mental Status: She is alert and oriented to person, place, and time.  Psychiatric:        Mood and Affect: Mood and affect and mood normal.        Behavior: Behavior normal.     ED Results / Procedures / Treatments   Labs (all labs ordered are listed, but only abnormal results are displayed) Labs Reviewed  CBC WITH DIFFERENTIAL/PLATELET - Abnormal; Notable for the following components:      Result Value   Hemoglobin  11.9 (*)    RDW 15.6 (*)    Platelets 430 (*)    All other components within normal limits  BASIC METABOLIC PANEL - Abnormal; Notable for the following components:   Potassium 3.4 (*)    Glucose, Bld 121 (*)    All other components within normal limits    EKG None  Radiology No results found.  Procedures Procedures   Medications Ordered in ED Medications  dexamethasone (DECADRON) injection 10 mg (has no administration in time range)    ED Course  I have reviewed the triage vital signs and the nursing notes.  Pertinent labs & imaging results that were available during my care of the patient were reviewed by me and considered in my medical decision making (see chart for details).    MDM Rules/Calculators/A&P                          Patient with hives, will and lip swelling.  She also reports having had some throat swelling last night.  She reports having had improvement of her symptoms while she was taking prednisone.  She only took this for 5 days.  I think that it would be reasonable to put her on a prednisone taper and give her a dose of Decadron now.  I will also send the patient with an EpiPen.  At this time, with very reassuring exam, and noting that the patient has been having the symptoms for about a week, I do not think that she requires admission or further observation in the hospital.  Will give referral to allergy.  Return precautions discussed.  Patient understands and agrees with this plan.   Final Clinical Impression(s) / ED Diagnoses Final diagnoses:  Hives  Lip swelling    Rx / DC Orders ED Discharge Orders    None       Roxy Horseman, PA-C 02/16/20 2237    Cathren Laine, MD 02/16/20 [phone]

## 2020-02-16 NOTE — Discharge Instructions (Signed)
I recommend taking any antihistamine such as zyrtec or claritin daily.  Use the epipen if you have shortness of breath or significant throat swelling and call 911.   Please follow-up with your doctor and/or the allergist.

## 2020-02-16 NOTE — ED Triage Notes (Signed)
Pt c/o lip swelling and sore throat over the last week, dx with covid on 1/14 and given prednisone. States the swelling got better and then worse after completing the course.

## 2020-05-17 ENCOUNTER — Other Ambulatory Visit: Payer: Self-pay

## 2020-05-17 ENCOUNTER — Encounter: Payer: Self-pay | Admitting: Allergy & Immunology

## 2020-05-17 ENCOUNTER — Ambulatory Visit: Payer: BC Managed Care – PPO | Admitting: Allergy & Immunology

## 2020-05-17 VITALS — BP 136/80 | HR 100 | Temp 98.6°F | Resp 16 | Ht 63.0 in | Wt 159.8 lb

## 2020-05-17 DIAGNOSIS — T783XXA Angioneurotic edema, initial encounter: Secondary | ICD-10-CM | POA: Insufficient documentation

## 2020-05-17 DIAGNOSIS — J302 Other seasonal allergic rhinitis: Secondary | ICD-10-CM

## 2020-05-17 DIAGNOSIS — T783XXD Angioneurotic edema, subsequent encounter: Secondary | ICD-10-CM | POA: Diagnosis not present

## 2020-05-17 NOTE — Patient Instructions (Addendum)
1. Angioedema - Testing today was largely unrevealing.  - Testing to the most common foods, in particular, was negative. - There is a the low positive predictive value of food allergy testing and hence the high possibility of false positives. - In contrast, food allergy testing has a high negative predictive value, therefore if testing is negative we can be relatively assured that they are indeed negative.  - Copy of testing results provided.  - Your history does not have any "red flags" such as fevers, joint pains, or permanent skin changes that would be concerning for a more serious cause of hives.  - We can hold off on labs for now, which I think is completely reasonable.  - Use Allegra twice daily for 1-2 weeks at the first sign of breakouts in the future.   2. Chronic rhinitis - Testing today showed: grasses, ragweed and weeds - Copy of test results provided.  - Stop: Singulair since this was clearly causing some irritability. - Start taking: Allegra (fexofenadine) 180mg  table 1-2 times daily as needed.   3. Return in about 3 months (around 08/16/2020).    Please inform 08/18/2020 of any Emergency Department visits, hospitalizations, or changes in symptoms. Call us before going to the ED for breathing or allergy symptoms since we might be able to fit you in for a sick visit. Feel free to contact us anytime with any questions, problems, or concerns.  It was a pleasure to meet you today!  Websites that have reliable patient information: 1. American Academy of Asthma, Allergy, and Immunology: www.aaaai.org 2. Food Allergy Research and Education (FARE): foodallergy.org 3. Mothers of Asthmatics: http://www.asthmacommunitynetwork.org 4. American College of Allergy, Asthma, and Immunology: www.acaai.org   COVID-19 Vaccine Information can be found at: [place] For questions related to vaccine distribution or appointments, please  email [email or web address] or call [phone].   We realize that you might be concerned about having an allergic reaction to the COVID19 vaccines. To help with that concern, WE ARE OFFERING THE COVID19 VACCINES IN OUR OFFICE! Ask the front desk for dates!     "Like" [phone] on Facebook and Instagram for our latest updates!      A healthy democracy works best when [place] participate! Make sure you are registered to vote! If you have moved or changed any of your contact information, you will need to get this updated before voting!  In some cases, you MAY be able to register to vote online: [employer]    Reducing Pollen Exposure  The American Academy of Allergy, Asthma and Immunology suggests the following steps to reduce your exposure to pollen during allergy seasons.    1. Do not hang sheets or clothing out to dry; pollen may collect on these items. 2. Do not mow lawns or spend time around freshly cut grass; mowing stirs up pollen. 3. Keep windows closed at night.  Keep car windows closed while driving. 4. Minimize morning activities outdoors, a time when pollen counts are usually at their highest. 5. Stay indoors as much as possible when pollen counts or humidity is high and on windy days when pollen tends to remain in the air longer. 6. Use air conditioning when possible.  Many air conditioners have filters that trap the pollen spores. 7. Use a HEPA room air filter to remove pollen form the indoor air you breathe.

## 2020-05-17 NOTE — Progress Notes (Signed)
NEW PATIENT  Date of Service/Encounter:  05/17/20  Consult requested by: [facility]   Assessment:   Angioedema - seemingly precipitated by the COVID19 booster  Seasonal allergic rhinitis (grasses, ragweed and weeds)  Plan/Recommendations:   1. Angioedema - Testing today was largely unrevealing.  - Testing to the most common foods, in particular, was negative. - There is a the low positive predictive value of food allergy testing and hence the high possibility of false positives. - In contrast, food allergy testing has a high negative predictive value, therefore if testing is negative we can be relatively assured that they are indeed negative.  - Copy of testing results provided.  - Your history does not have any "red flags" such as fevers, joint pains, or permanent skin changes that would be concerning for a more serious cause of hives.  - We can hold off on labs for now, which I think is completely reasonable.  - Use Allegra twice daily for 1-2 weeks at the first sign of breakouts in the future.   2. Chronic rhinitis - Testing today showed: grasses, ragweed and weeds - Copy of test results provided.  - Stop: Singulair since this was clearly causing some irritability. - Start taking: Allegra (fexofenadine) 180mg  table 1-2 times daily as needed.   3. Return in about 3 months (around 08/16/2020).    This note in its entirety was forwarded to the Provider who requested this consultation.  Subjective:   Carol Booth is a 40 y.o. female presenting today for evaluation of  Chief Complaint  Patient presents with  . Angioedema  . Urticaria    Carol Booth has a history of the following: Patient Active Problem List   Diagnosis Date Noted  . Angio-edema 05/17/2020  . Seasonal allergies 05/17/2020    History obtained from: chart review and patient.  Carol Booth was referred by [facility]     Carol Booth is a 40 y.o. female  presenting for an evaluation of swelling and urticaria.   She developed lip swelling two days after her booster. This was her first episode. She took a Benadryl and this resolved completely.   On January 13th, she got COVID and had sore throat. She did not have breathing problems. She broke out in hives and lip swelling around that time as well. She received prednisone for five days with this initial bout. Two days after finishing the prednisone, the sensation came back. She had hives over her torso area. During that time, she had the issues with two days of a sore throat with lip/hives as part of her COVID experience. She was on cetirizine 10mg  twice daily. This was not helping at all. Then four weeks after starting the symptoms, she started having throat swelling. Cetirizine and diphenhydramine were not helping at all. She actually woke up and felt that she could not swallow. She was using chloraseptic. In general, her episodes would start every Friday and worsen over the course of the weekend.   In the ED after work, she received a shot in the arm and a prescription for an EpiPen. She was placed on prednisone at this point as well.   She was placed on montelukast February 22nd. She started having them nightly and she has done fine with that. She stopped her cetirizine since this did not seem to help at all.   She is wondering whether this is related to egg or cheese. She does not eat meat at all, but  she does eat cheese and eggs. She does have flowers in her home.    Allergic Rhinitis Symptom History: She does have some sinus issues. Since she has been wearing masks, she has not had these sinus issues. She denies sneezing or itchy watery eyes or runny nose.   She has been using ibuprofen for some stomach pains which were diagnosed eventually as ovarian cysts. This was diagnosed on the Thursday before Easter. This was not on board when her swelling episodes worsened.   Otherwise, there is no  history of other atopic diseases, including asthma, food allergies, drug allergies, stinging insect allergies, urticaria or contact dermatitis. There is no significant infectious history. Vaccinations are up to date.    Past Medical History: Patient Active Problem List   Diagnosis Date Noted  . Angio-edema 05/17/2020  . Seasonal allergies 05/17/2020    Medication List:  Allergies as of 05/17/2020      Reactions   Septra [sulfamethoxazole-trimethoprim] Rash      Medication List       Accurate as of May 17, 2020  2:01 PM. If you have any questions, ask your nurse or doctor.        STOP taking these medications   predniSONE 20 MG tablet Commonly known as: DELTASONE Stopped by: Alfonse SpruceJoel Louis Aslee Such, MD     TAKE these medications   EPINEPHrine 0.3 mg/0.3 mL Soaj injection Commonly known as: EpiPen 2-Pak Inject 0.3 mg into the muscle once as needed for up to 1 dose (for severe allergic reaction). CAll 911 immediately if you have to use this medicine   hydrochlorothiazide 25 MG tablet Commonly known as: HYDRODIURIL Take 25 mg by mouth daily.   HYDROcodone-acetaminophen 5-325 MG tablet Commonly known as: NORCO/VICODIN Take 1-2 tablets by mouth every 6 hours as needed for pain.   ibuprofen 800 MG tablet Commonly known as: ADVIL Take 1 tablet (800 mg total) by mouth every 8 (eight) hours as needed for pain.   Levonorgestrel-Ethinyl Estradiol 0.15-0.03 &0.01 MG tablet Commonly known as: AMETHIA Take 1 tablet by mouth daily.   montelukast 10 MG tablet Commonly known as: SINGULAIR Take by mouth.   MULTIVITAMIN ADULTS PO Take by mouth.   ondansetron 4 MG tablet Commonly known as: Zofran Take 1 tablet (4 mg total) by mouth every 8 (eight) hours as needed for nausea or vomiting.       Birth History: non-contributory  Developmental History: non-contributory  Past Surgical History: Past Surgical History:  Procedure Laterality Date  . TONSILLECTOMY        Family History: Family History  Problem Relation Age of Onset  . Allergic rhinitis Neg Hx   . Angioedema Neg Hx   . Asthma Neg Hx   . Eczema Neg Hx   . Immunodeficiency Neg Hx   . Urticaria Neg Hx      Social History: Carol Booth lives at home in a townhome that is 337 months old.  There is vinyl in the main living areas and carpeting in the bedrooms.  She has gas heating and central cooling.  There are no animals inside or outside of the home.  There are no dust mite covers on the bedding.  She currently is a Midwifekindergarten teacher for the past 8 years.  She is not exposed to fumes, chemicals, or dust in her hobbies.  She thinks she might have some of these exposures in her workplace.  She does not use a HEPA filter.  She is not a tobacco user in  any form.   Review of Systems  Constitutional: Negative.  Negative for chills, fever, malaise/fatigue and weight loss.  HENT: Negative for congestion, ear discharge, ear pain and sinus pain.   Eyes: Negative for pain, discharge and redness.  Respiratory: Positive for cough and wheezing. Negative for sputum production and shortness of breath.   Cardiovascular: Negative.  Negative for chest pain and palpitations.  Gastrointestinal: Negative for abdominal pain, constipation, diarrhea, heartburn, nausea and vomiting.  Skin: Negative.  Negative for itching and rash.  Neurological: Negative for dizziness and headaches.  Endo/Heme/Allergies: Positive for environmental allergies. Does not bruise/bleed easily.       Objective:   Blood pressure 136/80, pulse 100, temperature 98.6 F (37 C), temperature source Temporal, resp. rate 16, height 5\' 3"  (1.6 m), weight 159 lb 12.8 oz (72.5 kg), SpO2 100 %. Body mass index is 28.31 kg/m.   Physical Exam:   Physical Exam Constitutional:      Appearance: She is well-developed.  HENT:     Head: Normocephalic and atraumatic.     Right Ear: Tympanic membrane, ear canal and external ear normal. No  drainage, swelling or tenderness. Tympanic membrane is not injected, scarred, erythematous, retracted or bulging.     Left Ear: Tympanic membrane, ear canal and external ear normal. No drainage, swelling or tenderness. Tympanic membrane is not injected, scarred, erythematous, retracted or bulging.     Nose: No nasal deformity, septal deviation, mucosal edema or rhinorrhea.     Right Sinus: No maxillary sinus tenderness or frontal sinus tenderness.     Left Sinus: No maxillary sinus tenderness or frontal sinus tenderness.     Mouth/Throat:     Mouth: Mucous membranes are not pale and not dry.     Pharynx: Uvula midline.  Eyes:     General:        Right eye: No discharge.        Left eye: No discharge.     Conjunctiva/sclera: Conjunctivae normal.     Right eye: Right conjunctiva is not injected. No chemosis.    Left eye: Left conjunctiva is not injected. No chemosis.    Pupils: Pupils are equal, round, and reactive to light.  Cardiovascular:     Rate and Rhythm: Normal rate and regular rhythm.     Heart sounds: Normal heart sounds.  Pulmonary:     Effort: Pulmonary effort is normal. No tachypnea, accessory muscle usage or respiratory distress.     Breath sounds: Normal breath sounds. No wheezing, rhonchi or rales.  Chest:     Chest wall: No tenderness.  Abdominal:     Tenderness: There is no abdominal tenderness. There is no guarding or rebound.  Lymphadenopathy:     Head:     Right side of head: No submandibular, tonsillar or occipital adenopathy.     Left side of head: No submandibular, tonsillar or occipital adenopathy.     Cervical: No cervical adenopathy.  Skin:    General: Skin is warm.     Capillary Refill: Capillary refill takes less than 2 seconds.     Coloration: Skin is not pale.     Findings: No abrasion, erythema, petechiae or rash. Rash is not papular, urticarial or vesicular.     Comments: Mild dermatographism.  Neurological:     Mental Status: She is alert.   Psychiatric:        Behavior: Behavior is cooperative.      Diagnostic studies:   Allergy Studies:  Airborne Adult Perc - 05/17/20 0954    Time Antigen Placed 5009    Allergen Manufacturer Waynette Buttery    Location Back    Number of Test 59    1. Control-Buffer 50% Glycerol Negative    2. Control-Histamine 1 mg/ml 3+    3. Albumin saline Negative    4. Bahia 2+    5. [place] 2+    6. Johnson 2+    7. [place] Blue 2+    8. Meadow Fescue 2+    9. Perennial Rye 2+    10. Sweet Vernal 2+    11. Timothy 2+    12. Cocklebur Negative    13. Burweed Marshelder Negative    14. Ragweed, short Negative    15. Ragweed, Giant 2+    16. Plantain,  English Negative    17. Lamb's Quarters Negative    18. Sheep Sorrell Negative    19. Rough Pigweed 2+    20. Marsh Elder, Rough Negative    21. Mugwort, Common Negative    22. Ash mix Negative    23. Birch mix Negative    24. Beech American Negative    25. Box, Elder Negative    26. Cedar, red Negative    27. Cottonwood, Guinea-[place]issau Negative    28. Elm mix Negative    29. Hickory Negative    30. Maple mix Negative    31. Oak, [place] mix Negative    32. Pecan Pollen Negative    33. Pine mix Negative    34. Sycamore Eastern Negative    35. Walnut, Black Pollen Negative    36. Alternaria alternata Negative    37. Cladosporium Herbarum Negative    38. Aspergillus mix Negative    39. Penicillium mix Negative    40. Bipolaris sorokiniana (Helminthosporium) Negative    41. Drechslera spicifera (Curvularia) Negative    42. Mucor plumbeus Negative    43. Fusarium moniliforme Negative    44. Aureobasidium pullulans (pullulara) Negative    45. Rhizopus oryzae Negative    46. Botrytis cinera Negative    47. Epicoccum nigrum Negative    48. Phoma betae Negative    49. Candida Albicans Negative    50. Trichophyton mentagrophytes Negative    51. Mite, D Farinae  5,000 AU/ml Negative    52. Mite, D Pteronyssinus  5,000 AU/ml Negative    53.  Cat Hair 10,000 BAU/ml Negative    54.  Dog Epithelia Negative    55. Mixed Feathers Negative    56. Horse Epithelia Negative    57. Cockroach, German Negative    58. Mouse Negative    59. Tobacco Leaf Negative          Food Perc - 05/17/20 0954      Test Information   Time Antigen Placed 3818    Allergen Manufacturer Waynette Buttery    Location Back    Number of allergen test 10      Food   1. Peanut Negative    2. Soybean food Negative    3. Wheat, whole Negative    4. Sesame Negative    5. Milk, cow Negative    6. Egg White, chicken Negative    7. Casein Negative    8. Shellfish mix Negative    9. Fish mix Negative    10. Cashew Negative           Allergy testing results were read and interpreted by myself, documented by clinical staff.  Quaid Yeakle, MD [facility]      

## 2020-06-22 ENCOUNTER — Encounter (HOSPITAL_BASED_OUTPATIENT_CLINIC_OR_DEPARTMENT_OTHER): Payer: Self-pay | Admitting: Obstetrics and Gynecology

## 2020-06-22 NOTE — H&P (Signed)
Carol Booth is an 40 y.o. female  G0 here for scheduled on laparoscopic right ovarian cystectomy, possible right oophorectomy, possible open due to a 7cm simple appearing cyst on her right side. Also possible lysis of adhesions. Pt reports chronic pelvic pain which radiated into her low back especiatlly with BM. She also feels bloated. She is slightly anxious but ready to have procedure done.  US showed : 7cm right sided ovarian cyst c/s hemorrragic cyst  Pertinent Gynecological History: Menses: flow is moderate Bleeding: regular monthly Contraception: OCP (estrogen/progesterone) DES exposure: denies Blood transfusions: none Sexually transmitted diseases: no past history Previous GYN Procedures: n/a  Last mammogram: n/a Date:    Last pap: normal Date:  OB History: G0, P0   Menstrual History: Menarche age: 57 No LMP recorded.    Past Medical History:  Diagnosis Date  . Angio-edema   . Hypertension   . Sinusitis   . Urticaria     Past Surgical History:  Procedure Laterality Date  . TONSILLECTOMY      Family History  Problem Relation Age of Onset  . Allergic rhinitis Neg Hx   . Angioedema Neg Hx   . Asthma Neg Hx   . Eczema Neg Hx   . Immunodeficiency Neg Hx   . Urticaria Neg Hx     Social History:  reports that she has never smoked. She has never used smokeless tobacco. She reports current alcohol use. She reports that she does not use drugs.  Allergies:  Allergies  Allergen Reactions  . Septra [Sulfamethoxazole-Trimethoprim] Rash    No medications prior to admission.    Review of Systems  Constitutional: Negative for activity change and fatigue.  Eyes: Negative for photophobia.  Respiratory: Negative for chest tightness and shortness of breath.   Cardiovascular: Negative for chest pain, palpitations and leg swelling.  Genitourinary: Positive for pelvic pain. Negative for vaginal pain.  Musculoskeletal: Negative for myalgias.  Neurological: Negative for  headaches.  Psychiatric/Behavioral: The patient is nervous/anxious.     There were no vitals taken for this visit. Physical Exam Vitals and nursing note reviewed. Exam conducted with a chaperone present.  Constitutional:      Appearance: Normal appearance. She is normal weight.  Cardiovascular:     Rate and Rhythm: Normal rate.  Pulmonary:     Effort: Pulmonary effort is normal.  Abdominal:     Palpations: Abdomen is soft.  Genitourinary:    General: Normal vulva.  Musculoskeletal:        General: Normal range of motion.     Cervical back: Normal range of motion.  Skin:    General: Skin is warm and dry.     Capillary Refill: Capillary refill takes 2 to 3 seconds.  Neurological:     General: No focal deficit present.     Mental Status: She is alert and oriented to person, place, and time. Mental status is at baseline.  Psychiatric:        Mood and Affect: Mood normal.        Behavior: Behavior normal.        Thought Content: Thought content normal.        Judgment: Judgment normal.     No results found for this or any previous visit (from the past 24 hour(s)).  No results found.  Assessment/Plan: 40yo G0 female with 7cm right sided ovarian cyst for laparoscopic right ovarian cystectomy, possible right oophorectomy, possible open, possible lysis of adhesions.  ERAS protocol   Confirm consent  for procedure  To OR when ready  Cathrine Muster 06/22/2020, 8:40 AM

## 2020-06-25 ENCOUNTER — Encounter (HOSPITAL_BASED_OUTPATIENT_CLINIC_OR_DEPARTMENT_OTHER): Payer: Self-pay | Admitting: Obstetrics and Gynecology

## 2020-06-25 ENCOUNTER — Other Ambulatory Visit: Payer: Self-pay

## 2020-06-25 NOTE — Progress Notes (Signed)
Spoke w/ via phone for pre-op interview--- Pt Lab needs dos---- CBC, Istat, T&S, EKG, Urine preg              Lab results------ n/a COVID test -----patient states asymptomatic no test needed Arrive at -------  0530 on [phone] NPO after MN NO Solid Food.  Clear liquids from MN until--- 0430 Med rec completed Medications to take morning of surgery ----- NONE Diabetic medication ----- n/a Patient instructed no nail polish to be worn day of surgery Patient instructed to bring photo id and insurance card day of surgery Patient aware to have Driver (ride ) / caregiver    for 24 hours after surgery -- mother, Corky Mull Patient Special Instructions ----- n/a Pre-Op special Istructions ----- n/a Patient verbalized understanding of instructions that were given at this phone interview. Patient denies shortness of breath, chest pain, fever, cough at this phone interview.

## 2020-06-27 NOTE — Anesthesia Preprocedure Evaluation (Addendum)
Anesthesia Evaluation  Patient identified by MRN, date of birth, ID band Patient awake    Reviewed: Allergy & Precautions, NPO status , Patient's Chart, lab work & pertinent test results  Airway Mallampati: II  TM Distance: >3 FB     Dental   Pulmonary neg pulmonary ROS,    breath sounds clear to auscultation       Cardiovascular hypertension,  Rhythm:Regular Rate:Normal     Neuro/Psych negative neurological ROS     GI/Hepatic negative GI ROS, Neg liver ROS,   Endo/Other  negative endocrine ROS  Renal/GU      Musculoskeletal   Abdominal   Peds  Hematology   Anesthesia Other Findings   Reproductive/Obstetrics                            Anesthesia Physical Anesthesia Plan  ASA: 3  Anesthesia Plan: General   Post-op Pain Management:    Induction: Intravenous  PONV Risk Score and Plan: 3 and Ondansetron, Dexamethasone and Midazolam  Airway Management Planned: Oral ETT  Additional Equipment:   Intra-op Plan:   Post-operative Plan: Extubation in OR  Informed Consent: I have reviewed the patients History and Physical, chart, labs and discussed the procedure including the risks, benefits and alternatives for the proposed anesthesia with the patient or authorized representative who has indicated his/her understanding and acceptance.     Dental advisory given  Plan Discussed with: CRNA and Anesthesiologist  Anesthesia Plan Comments:        Anesthesia Quick Evaluation

## 2020-06-28 ENCOUNTER — Ambulatory Visit (HOSPITAL_BASED_OUTPATIENT_CLINIC_OR_DEPARTMENT_OTHER)
Admission: RE | Admit: 2020-06-28 | Discharge: 2020-06-28 | Disposition: A | Discharge status: Home / Self Care | Payer: BC Managed Care – PPO | Attending: Obstetrics and Gynecology | Admitting: Obstetrics and Gynecology

## 2020-06-28 ENCOUNTER — Encounter (HOSPITAL_BASED_OUTPATIENT_CLINIC_OR_DEPARTMENT_OTHER)
Admission: RE | Disposition: A | Discharge status: Home / Self Care | Payer: Self-pay | Source: Home / Self Care | Attending: Obstetrics and Gynecology

## 2020-06-28 ENCOUNTER — Ambulatory Visit (HOSPITAL_BASED_OUTPATIENT_CLINIC_OR_DEPARTMENT_OTHER): Payer: BC Managed Care – PPO | Admitting: Anesthesiology

## 2020-06-28 ENCOUNTER — Encounter (HOSPITAL_BASED_OUTPATIENT_CLINIC_OR_DEPARTMENT_OTHER): Payer: Self-pay | Admitting: Obstetrics and Gynecology

## 2020-06-28 DIAGNOSIS — I10 Essential (primary) hypertension: Secondary | ICD-10-CM | POA: Insufficient documentation

## 2020-06-28 DIAGNOSIS — R102 Pelvic and perineal pain: Secondary | ICD-10-CM | POA: Diagnosis present

## 2020-06-28 DIAGNOSIS — N83201 Unspecified ovarian cyst, right side: Secondary | ICD-10-CM | POA: Diagnosis not present

## 2020-06-28 DIAGNOSIS — Z881 Allergy status to other antibiotic agents status: Secondary | ICD-10-CM | POA: Diagnosis not present

## 2020-06-28 DIAGNOSIS — Z90721 Acquired absence of ovaries, unilateral: Secondary | ICD-10-CM

## 2020-06-28 DIAGNOSIS — N809 Endometriosis, unspecified: Secondary | ICD-10-CM

## 2020-06-28 HISTORY — DX: Unspecified ovarian cyst, right side: N83.201

## 2020-06-28 HISTORY — DX: Other chronic pain: G89.29

## 2020-06-28 HISTORY — PX: LAPAROSCOPIC OVARIAN CYSTECTOMY: SHX6248

## 2020-06-28 HISTORY — DX: Acquired absence of ovaries, unilateral: Z90.721

## 2020-06-28 HISTORY — DX: Presence of spectacles and contact lenses: Z97.3

## 2020-06-28 HISTORY — DX: Sickle-cell trait: D57.3

## 2020-06-28 HISTORY — DX: Other seasonal allergic rhinitis: J30.2

## 2020-06-28 LAB — CBC
HCT: 35.1 % — ABNORMAL LOW (ref 36.0–46.0)
Hemoglobin: 11.7 g/dL — ABNORMAL LOW (ref 12.0–15.0)
MCH: 27.2 pg (ref 26.0–34.0)
MCHC: 33.3 g/dL (ref 30.0–36.0)
MCV: 81.6 fL (ref 80.0–100.0)
Platelets: 365 10*3/uL (ref 150–400)
RBC: 4.3 MIL/uL (ref 3.87–5.11)
RDW: 14.6 % (ref 11.5–15.5)
WBC: 6.5 10*3/uL (ref 4.0–10.5)
nRBC: 0 % (ref 0.0–0.2)

## 2020-06-28 LAB — POCT I-STAT, CHEM 8
BUN: 15 mg/dL (ref 6–20)
BUN: 7 mg/dL (ref 6–20)
Calcium, Ion: 1.23 mmol/L (ref 1.15–1.40)
Calcium, Ion: 1.18 mmol/L (ref 1.15–1.40)
Chloride: 103 mmol/L (ref 98–111)
Chloride: 102 mmol/L (ref 98–111)
Creatinine, Ser: 1 mg/dL (ref 0.44–1.00)
Creatinine, Ser: 0.8 mg/dL (ref 0.44–1.00)
Glucose, Bld: 99 mg/dL (ref 70–99)
Glucose, Bld: 96 mg/dL (ref 70–99)
HCT: 36 % (ref 36.0–46.0)
HCT: 37 % (ref 36.0–46.0)
Hemoglobin: 12.2 g/dL (ref 12.0–15.0)
Hemoglobin: 12.6 g/dL (ref 12.0–15.0)
Potassium: 3.8 mmol/L (ref 3.5–5.1)
Potassium: 2.8 mmol/L — ABNORMAL LOW (ref 3.5–5.1)
Sodium: 138 mmol/L (ref 135–145)
Sodium: 140 mmol/L (ref 135–145)
TCO2: 24 mmol/L (ref 22–32)
TCO2: 24 mmol/L (ref 22–32)

## 2020-06-28 LAB — TYPE AND SCREEN
ABO/RH(D): O POS
Antibody Screen: NEGATIVE

## 2020-06-28 LAB — ABO/RH: ABO/RH(D): O POS

## 2020-06-28 LAB — POCT PREGNANCY, URINE: Preg Test, Ur: NEGATIVE

## 2020-06-28 SURGERY — EXCISION, CYST, OVARY, LAPAROSCOPIC
Anesthesia: General | Site: Abdomen | Laterality: Right

## 2020-06-28 MED ORDER — KETOROLAC TROMETHAMINE 30 MG/ML IJ SOLN: 30.0000 mg | Freq: Once | INTRAMUSCULAR | Status: DC

## 2020-06-28 MED ORDER — SUCCINYLCHOLINE CHLORIDE 200 MG/10ML IV SOSY
PREFILLED_SYRINGE | INTRAVENOUS | Status: AC
  Filled 2020-06-28: qty 10

## 2020-06-28 MED ORDER — LIDOCAINE HCL 2 % IJ SOLN
INTRAMUSCULAR | Status: AC
  Filled 2020-06-28: qty 20

## 2020-06-28 MED ORDER — SUCCINYLCHOLINE CHLORIDE 200 MG/10ML IV SOSY
PREFILLED_SYRINGE | INTRAVENOUS | Status: DC | PRN
  Administered 2020-06-28: 120 mg via INTRAVENOUS

## 2020-06-28 MED ORDER — MIDAZOLAM HCL 2 MG/2ML IJ SOLN
INTRAMUSCULAR | Status: DC | PRN
  Administered 2020-06-28: 2 mg via INTRAVENOUS

## 2020-06-28 MED ORDER — ACETAMINOPHEN 500 MG PO TABS
1000.0000 mg | ORAL_TABLET | ORAL | Status: AC
Start: 2020-06-28 — End: 2020-06-28
  Administered 2020-06-28: 06:00:00 1000 mg via ORAL

## 2020-06-28 MED ORDER — PROPOFOL 10 MG/ML IV BOLUS
INTRAVENOUS | Status: AC
  Filled 2020-06-28: qty 20

## 2020-06-28 MED ORDER — FENTANYL CITRATE (PF) 100 MCG/2ML IJ SOLN: 25.0000 ug | INTRAMUSCULAR | Status: DC | PRN

## 2020-06-28 MED ORDER — KETAMINE HCL 50 MG/5ML IJ SOSY
PREFILLED_SYRINGE | INTRAMUSCULAR | Status: AC
  Filled 2020-06-28: qty 5

## 2020-06-28 MED ORDER — IBUPROFEN 800 MG PO TABS: 800.0000 mg | ORAL_TABLET | Freq: Three times a day (TID) | ORAL | 1 refills | Status: AC | PRN

## 2020-06-28 MED ORDER — ACETAMINOPHEN 10 MG/ML IV SOLN
INTRAVENOUS | Status: AC
  Filled 2020-06-28: qty 100

## 2020-06-28 MED ORDER — ACETAMINOPHEN 500 MG PO TABS
ORAL_TABLET | ORAL | Status: AC
  Filled 2020-06-28: qty 2

## 2020-06-28 MED ORDER — EPHEDRINE 5 MG/ML INJ
INTRAVENOUS | Status: AC
  Filled 2020-06-28: qty 10

## 2020-06-28 MED ORDER — LIDOCAINE 2% (20 MG/ML) 5 ML SYRINGE
INTRAMUSCULAR | Status: DC | PRN
  Administered 2020-06-28: 1.5 mg/kg/h via INTRAVENOUS

## 2020-06-28 MED ORDER — LIDOCAINE 2% (20 MG/ML) 5 ML SYRINGE
INTRAMUSCULAR | Status: DC | PRN
  Administered 2020-06-28: 100 mg via INTRAVENOUS

## 2020-06-28 MED ORDER — DEXAMETHASONE SODIUM PHOSPHATE 10 MG/ML IJ SOLN
INTRAMUSCULAR | Status: AC
  Filled 2020-06-28: qty 1

## 2020-06-28 MED ORDER — PROPOFOL 10 MG/ML IV BOLUS
INTRAVENOUS | Status: DC | PRN
  Administered 2020-06-28: 160 mg via INTRAVENOUS

## 2020-06-28 MED ORDER — KETAMINE HCL 10 MG/ML IJ SOLN
INTRAMUSCULAR | Status: DC | PRN
  Administered 2020-06-28: 25 mg via INTRAVENOUS

## 2020-06-28 MED ORDER — KETOROLAC TROMETHAMINE 30 MG/ML IJ SOLN
INTRAMUSCULAR | Status: DC | PRN
  Administered 2020-06-28: 30 mg via INTRAVENOUS

## 2020-06-28 MED ORDER — SUGAMMADEX SODIUM 200 MG/2ML IV SOLN
INTRAVENOUS | Status: DC | PRN
  Administered 2020-06-28: 200 mg via INTRAVENOUS

## 2020-06-28 MED ORDER — LIDOCAINE HCL (PF) 2 % IJ SOLN
INTRAMUSCULAR | Status: AC
  Filled 2020-06-28: qty 5

## 2020-06-28 MED ORDER — ROCURONIUM BROMIDE 10 MG/ML (PF) SYRINGE
PREFILLED_SYRINGE | INTRAVENOUS | Status: AC
  Filled 2020-06-28: qty 10

## 2020-06-28 MED ORDER — ARTIFICIAL TEARS OPHTHALMIC OINT
TOPICAL_OINTMENT | OPHTHALMIC | Status: AC
  Filled 2020-06-28: qty 3.5

## 2020-06-28 MED ORDER — BUPIVACAINE HCL (PF) 0.25 % IJ SOLN
INTRAMUSCULAR | Status: DC | PRN
  Administered 2020-06-28: 20 mL
  Administered 2020-06-28: 10 mL

## 2020-06-28 MED ORDER — FENTANYL CITRATE (PF) 100 MCG/2ML IJ SOLN
INTRAMUSCULAR | Status: DC | PRN
  Administered 2020-06-28: 50 ug via INTRAVENOUS
  Administered 2020-06-28: 100 ug via INTRAVENOUS

## 2020-06-28 MED ORDER — ROCURONIUM BROMIDE 10 MG/ML (PF) SYRINGE
PREFILLED_SYRINGE | INTRAVENOUS | Status: DC | PRN
  Administered 2020-06-28: 40 mg via INTRAVENOUS
  Administered 2020-06-28: 10 mg via INTRAVENOUS

## 2020-06-28 MED ORDER — LACTATED RINGERS IV SOLN: INTRAVENOUS | Status: DC

## 2020-06-28 MED ORDER — OXYCODONE-ACETAMINOPHEN 5-325 MG PO TABS: 1.0000 | ORAL_TABLET | ORAL | 0 refills | Status: AC | PRN

## 2020-06-28 MED ORDER — METOCLOPRAMIDE HCL 5 MG/ML IJ SOLN
5.0000 mg | Freq: Once | INTRAMUSCULAR | Status: AC
  Administered 2020-06-28: 5 mg via INTRAVENOUS

## 2020-06-28 MED ORDER — SODIUM CHLORIDE 0.9 % IR SOLN
Status: DC | PRN
  Administered 2020-06-28: 600 mL

## 2020-06-28 MED ORDER — DEXAMETHASONE SODIUM PHOSPHATE 10 MG/ML IJ SOLN
INTRAMUSCULAR | Status: DC | PRN
  Administered 2020-06-28: 10 mg via INTRAVENOUS

## 2020-06-28 MED ORDER — SIMETHICONE 80 MG PO CHEW: 80.0000 mg | CHEWABLE_TABLET | Freq: Four times a day (QID) | ORAL | 0 refills | Status: AC | PRN

## 2020-06-28 MED ORDER — ONDANSETRON HCL 4 MG/2ML IJ SOLN
INTRAMUSCULAR | Status: DC | PRN
  Administered 2020-06-28: 4 mg via INTRAVENOUS

## 2020-06-28 MED ORDER — ONDANSETRON HCL 4 MG/2ML IJ SOLN
INTRAMUSCULAR | Status: AC
  Filled 2020-06-28: qty 2

## 2020-06-28 MED ORDER — MIDAZOLAM HCL 2 MG/2ML IJ SOLN
INTRAMUSCULAR | Status: AC
  Filled 2020-06-28: qty 2

## 2020-06-28 MED ORDER — FENTANYL CITRATE (PF) 250 MCG/5ML IJ SOLN
INTRAMUSCULAR | Status: AC
  Filled 2020-06-28: qty 5

## 2020-06-28 MED ORDER — METOCLOPRAMIDE HCL 5 MG/ML IJ SOLN
INTRAMUSCULAR | Status: AC
  Filled 2020-06-28: qty 2

## 2020-06-28 MED ORDER — KETOROLAC TROMETHAMINE 30 MG/ML IJ SOLN
INTRAMUSCULAR | Status: AC
  Filled 2020-06-28: qty 1

## 2020-06-28 MED ORDER — EPHEDRINE SULFATE-NACL 50-0.9 MG/10ML-% IV SOSY
PREFILLED_SYRINGE | INTRAVENOUS | Status: DC | PRN
Start: 2020-06-28 — End: 2020-06-28
  Administered 2020-06-28: 10 mg via INTRAVENOUS

## 2020-06-28 MED ORDER — POVIDONE-IODINE 10 % EX SWAB: 2.0000 "application " | Freq: Once | CUTANEOUS | Status: DC

## 2020-06-28 SURGICAL SUPPLY — 48 items
ADH SKN CLS APL DERMABOND .7 (GAUZE/BANDAGES/DRESSINGS) ×2
BAG SPEC RTRVL LRG 6X4 10 (ENDOMECHANICALS) ×2
BARRIER ADHS 3X4 INTERCEED (GAUZE/BANDAGES/DRESSINGS) ×4 IMPLANT
BRR ADH 4X3 ABS CNTRL BYND (GAUZE/BANDAGES/DRESSINGS) ×2
CABLE HIGH FREQUENCY MONO STRZ (ELECTRODE) IMPLANT
CATH ROBINSON RED A/P 16FR (CATHETERS) ×4 IMPLANT
COVER MAYO STAND STRL (DRAPES) ×4 IMPLANT
COVER WAND RF STERILE (DRAPES) ×4 IMPLANT
DECANTER SPIKE VIAL GLASS SM (MISCELLANEOUS) IMPLANT
DERMABOND ADVANCED (GAUZE/BANDAGES/DRESSINGS) ×2
DERMABOND ADVANCED .7 DNX12 (GAUZE/BANDAGES/DRESSINGS) ×2 IMPLANT
DRSG OPSITE POSTOP 3X4 (GAUZE/BANDAGES/DRESSINGS) ×4 IMPLANT
DURAPREP 26ML APPLICATOR (WOUND CARE) ×4 IMPLANT
GAUZE 4X4 16PLY RFD (DISPOSABLE) IMPLANT
GLOVE SURG ENC MOIS LTX SZ6.5 (GLOVE) ×20 IMPLANT
GLOVE SURG ORTHO LTX SZ7.5 (GLOVE) ×4 IMPLANT
GLOVE SURG UNDER POLY LF SZ6.5 (GLOVE) ×8 IMPLANT
GLOVE SURG UNDER POLY LF SZ7 (GLOVE) ×8 IMPLANT
GOWN STRL REUS W/ TWL LRG LVL3 (GOWN DISPOSABLE) ×2 IMPLANT
GOWN STRL REUS W/TWL LRG LVL3 (GOWN DISPOSABLE) ×16 IMPLANT
GOWN STRL REUS W/TWL XL LVL3 (GOWN DISPOSABLE) ×4 IMPLANT
IV NS IRRIG 3000ML ARTHROMATIC (IV SOLUTION) ×4 IMPLANT
KIT TURNOVER CYSTO (KITS) ×4 IMPLANT
MANIFOLD NEPTUNE II (INSTRUMENTS) ×4 IMPLANT
MANIPULATOR UTERINE 7CM CLEARV (MISCELLANEOUS) ×4 IMPLANT
NS IRRIG 1000ML POUR BTL (IV SOLUTION) IMPLANT
NS IRRIG 500ML POUR BTL (IV SOLUTION) ×4 IMPLANT
PACK LAPAROSCOPY BASIN (CUSTOM PROCEDURE TRAY) ×4 IMPLANT
PACK TRENDGUARD 450 HYBRID PRO (MISCELLANEOUS) ×2 IMPLANT
POUCH SPECIMEN RETRIEVAL 10MM (ENDOMECHANICALS) ×4 IMPLANT
PROTECTOR NERVE ULNAR (MISCELLANEOUS) ×4 IMPLANT
SET SUCTION IRRIG HYDROSURG (IRRIGATION / IRRIGATOR) ×4 IMPLANT
SET TRI-LUMEN FLTR TB AIRSEAL (TUBING) ×4 IMPLANT
SET TUBE SMOKE EVAC HIGH FLOW (TUBING) IMPLANT
SHEARS HARMONIC ACE PLUS 36CM (ENDOMECHANICALS) ×4 IMPLANT
SLEEVE XCEL OPT CAN 5 100 (ENDOMECHANICALS) IMPLANT
SOLUTION ELECTROLUBE (MISCELLANEOUS) IMPLANT
SUT VIC AB 3-0 PS2 18 (SUTURE) ×4
SUT VIC AB 3-0 PS2 18XBRD (SUTURE) ×2 IMPLANT
SUT VICRYL 0 UR6 27IN ABS (SUTURE) ×4 IMPLANT
SYSTEM CARTER THOMASON II (TROCAR) ×4 IMPLANT
TOWEL OR 17X26 10 PK STRL BLUE (TOWEL DISPOSABLE) ×4 IMPLANT
TRENDGUARD 450 HYBRID PRO PACK (MISCELLANEOUS) ×4
TROCAR BLADELESS OPT 5 100 (ENDOMECHANICALS) ×4 IMPLANT
TROCAR PORT AIRSEAL 5X120 (TROCAR) ×4 IMPLANT
TROCAR PORT AIRSEAL 8X120 (TROCAR) ×4 IMPLANT
TROCAR XCEL NON-BLD 11X100MML (ENDOMECHANICALS) ×4 IMPLANT
WARMER LAPAROSCOPE (MISCELLANEOUS) ×4 IMPLANT

## 2020-06-28 NOTE — Anesthesia Postprocedure Evaluation (Signed)
Anesthesia Post Note  Patient: Carol Booth  Procedure(s) Performed: LAPAROSCOPIC RIGHT OVARIAN CYSTECTOMY, SALPINGO-OOPHORECTOMY (Right: Abdomen)     Patient location during evaluation: PACU Anesthesia Type: General Level of consciousness: awake Pain management: pain level controlled Respiratory status: spontaneous breathing Cardiovascular status: stable Postop Assessment: no apparent nausea or vomiting Anesthetic complications: no   No notable events documented.  Last Vitals:  Vitals:   06/28/20 1030 06/28/20 1145  BP: 122/74 133/82  Pulse: 97 99  Resp: (!) 7 14  Temp:  36.7 C  SpO2: 97% 100%    Last Pain:  Vitals:   06/28/20 1145  TempSrc:   PainSc: 8                  Ava Tangney

## 2020-06-28 NOTE — Anesthesia Procedure Notes (Signed)
Procedure Name: Intubation Date/Time: 06/28/2020 8:00 AM Performed by: Mechele Claude, CRNA Pre-anesthesia Checklist: Patient identified, Emergency Drugs available, Suction available and Patient being monitored Patient Re-evaluated:Patient Re-evaluated prior to induction Oxygen Delivery Method: Circle system utilized Preoxygenation: Pre-oxygenation with 100% oxygen Induction Type: IV induction and Cricoid Pressure applied Ventilation: Mask ventilation without difficulty Laryngoscope Size: Mac and 3 Grade View: Grade II Tube type: Oral Tube size: 7.0 mm Number of attempts: 1 Airway Equipment and Method: Stylet and Oral airway Placement Confirmation: ETT inserted through vocal cords under direct vision, positive ETCO2 and breath sounds checked- equal and bilateral Secured at: 21 cm Tube secured with: Tape Dental Injury: Teeth and Oropharynx as per pre-operative assessment

## 2020-06-28 NOTE — Transfer of Care (Signed)
Immediate Anesthesia Transfer of Care Note  Patient: Carol Booth  Procedure(s) Performed: Procedure(s) (LRB): LAPAROSCOPIC RIGHT OVARIAN CYSTECTOMY, SALPINGO-OOPHORECTOMY (Right)  Patient Location: PACU  Anesthesia Type: General  Level of Consciousness: awake, alert  and oriented  Airway & Oxygen Therapy: Patient Spontanous Breathing and Patient connected to face mask oxygen  Post-op Assessment: Report given to PACU RN and Post -op Vital signs reviewed and stable  Post vital signs: Reviewed and stable  Complications: No apparent anesthesia complications  Last Vitals:  Vitals Value Taken Time  BP 121/74 06/28/20 0930  Temp    Pulse 114 06/28/20 0931  Resp 12 06/28/20 0931  SpO2 100 % 06/28/20 0931  Vitals shown include unvalidated device data.  Last Pain:  Vitals:   06/28/20 0606  TempSrc: Oral      Patients Stated Pain Goal: 5 (06/28/20 0606)  Complications: No notable events documented.

## 2020-06-28 NOTE — Discharge Instructions (Addendum)
Call office with any concerns [phone] ? ? ?Post Anesthesia Home Care Instructions ? ?Activity: ?Get plenty of rest for the remainder of the day. A responsible individual must stay with you for 24 hours following the procedure.  ?For the next 24 hours, DO NOT: ?-Drive a car ?-Operate machinery ?-Drink alcoholic beverages ?-Take any medication unless instructed by your physician ?-Make any legal decisions or sign important papers. ? ?Meals: ?Start with liquid foods such as gelatin or soup. Progress to regular foods as tolerated. Avoid greasy, spicy, heavy foods. If nausea and/or vomiting occur, drink only clear liquids until the nausea and/or vomiting subsides. Call your physician if vomiting continues. ? ?Special Instructions/Symptoms: ?Your throat may feel dry or sore from the anesthesia or the breathing tube placed in your throat during surgery. If this causes discomfort, gargle with warm salt water. The discomfort should disappear within 24 hours. ? ?If you had a scopolamine patch placed behind your ear for the management of post- operative nausea and/or vomiting: ? ?1. The medication in the patch is effective for 72 hours, after which it should be removed.  Wrap patch in a tissue and discard in the trash. Wash hands thoroughly with soap and water. ?2. You may remove the patch earlier than 72 hours if you experience unpleasant side effects which may include dry mouth, dizziness or visual disturbances. ?3. Avoid touching the patch. Wash your hands with soap and water after contact with the patch. ?    ?

## 2020-06-28 NOTE — Interval H&P Note (Signed)
History and Physical Interval Note:  Pt seen. No change since H/P. Pt consents to procedure as laparoscopic right ovarian cystectomy with possible right salpingo-oopherectomy, possible open To OR when ready  06/28/2020 7:26 AM  Carol Booth  has presented today for surgery, with the diagnosis of pain in pelvis, hemorrhagic cyst of ovary.  The various methods of treatment have been discussed with the patient and family. After consideration of risks, benefits and other options for treatment, the patient has consented to  Procedure(s) with comments: LAPAROSCOPIC OVARIAN CYSTECTOMY, POSSIBLE OPEN (Right) - possible open POSSIBLE LAPAROSCOPIC OOPHORECTOMY (Right) as a surgical intervention.  [name] history has been reviewed, patient examined, no change in status, stable for surgery.  I have reviewed the patient's chart and labs.  Questions were answered to the patient's satisfaction.     Cathrine Muster

## 2020-06-28 NOTE — Op Note (Addendum)
Operative Note    Preoperative Diagnosis Pelvic pain Right hemorrhagic cyst Hypokalemia   Postoperative Diagnosis Pelvic pain Right adhesed endometrioma Diffuse endometriosis in pelvis Hypokalemia   Procedure: Laparoscopic right salpingo-oopherectomy   Surgeon: Britt Bottom, DO  Assist: Meisinger T, MD  Anesthesia: General   Fluids:LR at EBL: 45ml UOP:   Findings: Diffuse endometriosis over entire pelvis area including anterior and lateral abdominal wall. Obliterated cul de sac with adhesions of posterior uterus to bowel. Large right endometrioma torsed over fallopian tube   Specimen: Right ovary and tube / endometrioma   Procedure Note  Consent obtain for procedure in periop area. Patient was taken to the operating room where she was comfortably placed in the dorsal supine position. General anesthesia was obtained without difficulty. She was then prepped and draped in the normal sterile fashion with arms tucked at her sides and legs in dorsal lithotomy position. An appropriate timeout was performed.  The bladder was emptied with a red rubber and a hulka manipulator placed vaginally.  Her legs were lowered and attention was then turned to the patient's abdomen . A 5 mm infraumbilical incision was made after 0.25% marcaine injected.  The optiview trocar and camera were easily advanced into the abdominal cavity.Gas flow was then applied and a pneumoperitoneum obtained with approximate 3 L of CO2 gas. A second trocar, 5cm airseal, was placed in the patients right lower uterine segment under visualization. With patient in trendelenburg the tubes and ovaries were inspected with the following findings:   Diffuse endometriosis over entire pelvis area including anterior and lateral abdominal wall. Obliterated cul de sac with adhesions of posterior uterus to bowel. Large right endometrioma torsed over fallopian tube  A third 28mm trocar was then placed in the left lower  quadrant under direct visualization after injecting the site with marcaine.   Using a combination of blunt dissection and use of the harmonic scalpel, the  right sided ovary with endometrioma was freed from adhesions to lateral sidewall and fallopian tube. The ovary and tube were then hemostatically excised  A few adhesions in the cul de sac were then ligated however due to severe adhesions and obliterated planes in posterior cul de sac, no attempt was made for adhesiolysis here.  Next. an endocatch bag was introduced and the tube and ovary scooped into the bag. The specimen was removed by suctioning chocolate colored fluid noted after pucturing the cyst in the bag. Eventually, the entire sample was removed with the specimen sent to pathology for evaluation.   Copious irrigation of the pelvic was performed. Interceed was placed over the excised area as well as the fundus of the uterus do decrease risk of further adhesions.Hemostasis was noted.   At this time the patient was flattened and a carter thomasen used to close the 57mm left trocar site. The remainder of the incisions were closed with 4-0 vicryl and dermabond after gas reduced via airseal.  The hulka manipulator was removed. Hemostasis noted.   Patient was then awakened and taken to the recovery room in good condition. Counts were all confirmed.

## 2020-06-29 ENCOUNTER — Encounter (HOSPITAL_BASED_OUTPATIENT_CLINIC_OR_DEPARTMENT_OTHER): Payer: Self-pay | Admitting: Obstetrics and Gynecology

## 2020-06-29 LAB — SURGICAL PATHOLOGY

## 2020-06-29 NOTE — Addendum Note (Signed)
Addendum  created 06/29/20 1254 by Lewie Loron, MD   Clinical Note Signed

## 2020-06-29 NOTE — Progress Notes (Signed)
Anesthesia Note: Approached by Matilde Haymaker, RN about issues discovered on postop call. Pt complaining of sore throat and some chest pressure/pain. I called the patient and spoke to the husband who was with her at home. She endorses a scratchy and sore throat very typical of endotracheal intubation. I explained this and reinforced Susan's recommendation of salt water gargle, lozenges or chloraseptic spray. The chest discomfort they described as substernal, mild, not radiating to arm or jaw. She also isn't nauseated. I stated that it did not sound cardiac in nature to me, but that if they had any concerns to seek evaluation with her primary MD or otherwise. The husband stated they would try the lozenges and go from there.

## 2020-08-23 ENCOUNTER — Ambulatory Visit: Payer: BC Managed Care – PPO | Admitting: Allergy & Immunology

## 2021-01-10 ENCOUNTER — Encounter (HOSPITAL_BASED_OUTPATIENT_CLINIC_OR_DEPARTMENT_OTHER): Payer: Self-pay | Admitting: Emergency Medicine

## 2021-01-10 ENCOUNTER — Emergency Department (HOSPITAL_BASED_OUTPATIENT_CLINIC_OR_DEPARTMENT_OTHER): Payer: BC Managed Care – PPO

## 2021-01-10 ENCOUNTER — Emergency Department (HOSPITAL_BASED_OUTPATIENT_CLINIC_OR_DEPARTMENT_OTHER)
Admission: EM | Admit: 2021-01-10 | Discharge: 2021-01-10 | Disposition: A | Discharge status: Home / Self Care | Payer: BC Managed Care – PPO | Attending: Emergency Medicine | Admitting: Emergency Medicine

## 2021-01-10 DIAGNOSIS — Z20822 Contact with and (suspected) exposure to covid-19: Secondary | ICD-10-CM | POA: Diagnosis not present

## 2021-01-10 DIAGNOSIS — E876 Hypokalemia: Secondary | ICD-10-CM

## 2021-01-10 DIAGNOSIS — Z8616 Personal history of COVID-19: Secondary | ICD-10-CM | POA: Insufficient documentation

## 2021-01-10 DIAGNOSIS — R1030 Lower abdominal pain, unspecified: Secondary | ICD-10-CM | POA: Diagnosis present

## 2021-01-10 DIAGNOSIS — I10 Essential (primary) hypertension: Secondary | ICD-10-CM | POA: Insufficient documentation

## 2021-01-10 DIAGNOSIS — Z79899 Other long term (current) drug therapy: Secondary | ICD-10-CM | POA: Insufficient documentation

## 2021-01-10 DIAGNOSIS — K5641 Fecal impaction: Secondary | ICD-10-CM

## 2021-01-10 DIAGNOSIS — K5900 Constipation, unspecified: Secondary | ICD-10-CM

## 2021-01-10 LAB — CBC WITH DIFFERENTIAL/PLATELET
Abs Immature Granulocytes: 0.02 10*3/uL (ref 0.00–0.07)
Basophils Absolute: 0.1 10*3/uL (ref 0.0–0.1)
Basophils Relative: 1 %
Eosinophils Absolute: 0.1 10*3/uL (ref 0.0–0.5)
Eosinophils Relative: 1 %
HCT: 36.9 % (ref 36.0–46.0)
Hemoglobin: 12.2 g/dL (ref 12.0–15.0)
Immature Granulocytes: 0 %
Lymphocytes Relative: 22 %
Lymphs Abs: 2.1 10*3/uL (ref 0.7–4.0)
MCH: 26.8 pg (ref 26.0–34.0)
MCHC: 33.1 g/dL (ref 30.0–36.0)
MCV: 81.1 fL (ref 80.0–100.0)
Monocytes Absolute: 0.6 10*3/uL (ref 0.1–1.0)
Monocytes Relative: 6 %
Neutro Abs: 6.5 10*3/uL (ref 1.7–7.7)
Neutrophils Relative %: 70 %
Platelets: 430 10*3/uL — ABNORMAL HIGH (ref 150–400)
RBC: 4.55 MIL/uL (ref 3.87–5.11)
RDW: 14.1 % (ref 11.5–15.5)
WBC: 9.3 10*3/uL (ref 4.0–10.5)
nRBC: 0 % (ref 0.0–0.2)

## 2021-01-10 LAB — RESP PANEL BY RT-PCR (FLU A&B, COVID) ARPGX2
Influenza A by PCR: NEGATIVE
Influenza B by PCR: NEGATIVE
SARS Coronavirus 2 by RT PCR: NEGATIVE

## 2021-01-10 LAB — COMPREHENSIVE METABOLIC PANEL
ALT: 24 U/L (ref 0–44)
AST: 25 U/L (ref 15–41)
Albumin: 3.7 g/dL (ref 3.5–5.0)
Alkaline Phosphatase: 65 U/L (ref 38–126)
Anion gap: 13 (ref 5–15)
BUN: 9 mg/dL (ref 6–20)
CO2: 23 mmol/L (ref 22–32)
Calcium: 9.3 mg/dL (ref 8.9–10.3)
Chloride: 101 mmol/L (ref 98–111)
Creatinine, Ser: 0.86 mg/dL (ref 0.44–1.00)
GFR, Estimated: >60 mL/min (ref >60)
Glucose, Bld: 112 mg/dL — ABNORMAL HIGH (ref 70–99)
Potassium: 2.9 mmol/L — ABNORMAL LOW (ref 3.5–5.1)
Sodium: 137 mmol/L (ref 135–145)
Total Bilirubin: 0.4 mg/dL (ref 0.3–1.2)
Total Protein: 8.1 g/dL (ref 6.5–8.1)

## 2021-01-10 LAB — URINALYSIS, ROUTINE W REFLEX MICROSCOPIC
Bilirubin Urine: NEGATIVE
Glucose, UA: NEGATIVE mg/dL
Hgb urine dipstick: NEGATIVE
Ketones, ur: NEGATIVE mg/dL
Leukocytes,Ua: NEGATIVE
Nitrite: NEGATIVE
Protein, ur: 30 mg/dL — AB
Specific Gravity, Urine: 1.015 (ref 1.005–1.030)
pH: 8.5 — ABNORMAL HIGH (ref 5.0–8.0)

## 2021-01-10 LAB — PREGNANCY, URINE: Preg Test, Ur: NEGATIVE

## 2021-01-10 LAB — URINALYSIS, MICROSCOPIC (REFLEX): RBC / HPF: >50 RBC/hpf (ref 0–5)

## 2021-01-10 LAB — LIPASE, BLOOD: Lipase: 34 U/L (ref 11–51)

## 2021-01-10 LAB — MAGNESIUM: Magnesium: 1.8 mg/dL (ref 1.7–2.4)

## 2021-01-10 MED ORDER — IOHEXOL 300 MG/ML  SOLN
100.0000 mL | Freq: Once | INTRAMUSCULAR | Status: AC | PRN
  Administered 2021-01-10: 08:00:00 100 mL via INTRAVENOUS

## 2021-01-10 MED ORDER — MINERAL OIL RE ENEM: 1.0000 | ENEMA | Freq: Once | RECTAL | 1 refills | Status: AC

## 2021-01-10 MED ORDER — ONDANSETRON HCL 4 MG/2ML IJ SOLN
4.0000 mg | Freq: Once | INTRAMUSCULAR | Status: AC
  Administered 2021-01-10: 07:00:00 4 mg via INTRAVENOUS
  Filled 2021-01-10: qty 2

## 2021-01-10 MED ORDER — SODIUM CHLORIDE 0.9 % IV BOLUS
1000.0000 mL | Freq: Once | INTRAVENOUS | Status: AC
  Administered 2021-01-10: 07:00:00 1000 mL via INTRAVENOUS

## 2021-01-10 MED ORDER — KETOROLAC TROMETHAMINE 30 MG/ML IJ SOLN
30.0000 mg | Freq: Once | INTRAMUSCULAR | Status: AC
  Administered 2021-01-10: 09:00:00 30 mg via INTRAVENOUS
  Filled 2021-01-10: qty 1

## 2021-01-10 MED ORDER — POTASSIUM CHLORIDE 10 MEQ/100ML IV SOLN
10.0000 meq | INTRAVENOUS | Status: DC
  Administered 2021-01-10: 09:00:00 10 meq via INTRAVENOUS
  Filled 2021-01-10: qty 100

## 2021-01-10 MED ORDER — POTASSIUM CHLORIDE ER 10 MEQ PO TBCR: 20.0000 meq | EXTENDED_RELEASE_TABLET | Freq: Every day | ORAL | 0 refills | Status: AC

## 2021-01-10 MED ORDER — MORPHINE SULFATE (PF) 4 MG/ML IV SOLN
4.0000 mg | Freq: Once | INTRAVENOUS | Status: AC
  Administered 2021-01-10: 07:00:00 4 mg via INTRAVENOUS
  Filled 2021-01-10: qty 1

## 2021-01-10 MED ORDER — BISACODYL 10 MG RE SUPP
10.0000 mg | Freq: Every day | RECTAL | 0 refills | Status: AC | PRN
Start: 2021-01-10 — End: ?

## 2021-01-10 MED ORDER — BISACODYL 5 MG PO TBEC
10.0000 mg | DELAYED_RELEASE_TABLET | Freq: Every day | ORAL | 0 refills | Status: AC | PRN
Start: 2021-01-10 — End: ?

## 2021-01-10 MED ORDER — POTASSIUM CHLORIDE 10 MEQ/100ML IV SOLN: 10.0000 meq | Freq: Once | INTRAVENOUS | Status: DC

## 2021-01-10 NOTE — ED Provider Notes (Signed)
I assumed care of this patient from Dr Judd Lien at 0730.  Briefly this is a 40-year female with a history of endometriosis, chronic pelvic pain, status post right-sided oopherectomy/salpinghectomy in June 2022, who presented to ED with lower abdominal pelvic pain, nausea and vomiting.  Blood test showed normal white blood cell count.  She has hypokalemia, which has been a chronic problem for her, potassium of 2.9.  I have ordered IV potassium.  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  I advised that she resume these medications and to prescribe some.  Covid/flu negative. Lipase wnl.  UA showing no clear evidence of infection  CT abdomen pelvis showing significant stool burden in the rectal vault, possible fecal impaction.  The patient ports that she did have what she felt was a regular bowel movement yesterday and did not realize that she was constipated.  This is a first-time issue for her.  I do think that she is seeing a soft stool and is passing stool, be reasonable to try laxatives and an enema at home, as she would prefer not to attempt digital disimpaction here.  A single run of IV potassium has been completed.  I will start her on oral potassium and prescribe laxatives for home.  She verbalized understanding and agreement with discharge plans.  Otherwise she feels that her pain is significantly improved with the medications given in the ED, she is no longer nauseated.   Terald Sleeper, MD 01/10/21 [phone]

## 2021-01-10 NOTE — ED Notes (Signed)
Pt

## 2021-01-10 NOTE — ED Provider Notes (Signed)
[facility] EMERGENCY DEPARTMENT Provider Note   CSN: [id number] Arrival date & time: 01/10/21  6967     History Chief Complaint  Patient presents with   Abdominal Pain    Carol Booth is a 40 y.o. female.  Patient is a 40 year old female with past medical history of hypertension, endometriosis with chronic pelvic pain.  Patient diagnosed with COVID-19 by home test last week.  She presents today for evaluation of abdominal pain.  She describes pain to her lower abdomen that radiates through to her anus.  This has been worsening for the past 3 days.  She denies fevers or chills.  She denies bowel or bladder complaints.  She also describes headache.  She denies any visual disturbances, numbness, or tingling.  The history is provided by the patient.  Abdominal Pain Pain location:  Suprapubic Pain quality: cramping   Pain radiates to:  Does not radiate Pain severity:  Moderate Onset quality:  Gradual Duration:  3 days Timing:  Constant Progression:  Worsening Chronicity:  New Relieved by:  Nothing Worsened by:  Movement and palpation     Past Medical History:  Diagnosis Date   Chronic pelvic pain in female    History of 2019 novel coronavirus disease (COVID-19) 02/07/2020   positive result in care everywhere, per pt mild symptoms along with hives and lip swelling, all resolved   Hypertension    followed by pcp   Right ovarian cyst    S/P unilateral salpingo-oophorectomy 06/28/2020   Right endometrioma   Seasonal allergic rhinitis    Sickle cell trait (HCC)    Wears glasses     Patient Active Problem List   Diagnosis Date Noted   S/P unilateral salpingo-oophorectomy 06/28/2020   Endometriosis 06/28/2020   Angio-edema 05/17/2020   Seasonal allergies 05/17/2020    Past Surgical History:  Procedure Laterality Date   LAPAROSCOPIC OVARIAN CYSTECTOMY Right 06/28/2020   Procedure: LAPAROSCOPIC RIGHT OVARIAN CYSTECTOMY, SALPINGO-OOPHORECTOMY;  Surgeon:  Edwinna Areola, DO;  Location: [facility];  Service: Gynecology;  Laterality: Right;  possible open   TONSILLECTOMY  child   WISDOM TOOTH EXTRACTION  teen     OB History   No obstetric history on file.     Family History  Problem Relation Age of Onset   Allergic rhinitis Neg Hx    Angioedema Neg Hx    Asthma Neg Hx    Eczema Neg Hx    Immunodeficiency Neg Hx    Urticaria Neg Hx     Social History   Tobacco Use   Smoking status: Never   Smokeless tobacco: Never  Vaping Use   Vaping Use: Never used  Substance Use Topics   Alcohol use: Yes    Comment: social   Drug use: Never    Home Medications Prior to Admission medications   Medication Sig Start Date End Date Taking? Authorizing Provider  Cholecalciferol (VITAMIN D-3) 125 MCG (5000 UT) TABS Take 1 tablet by mouth daily.   Yes [provider]  hydrochlorothiazide (HYDRODIURIL) 25 MG tablet Take 25 mg by mouth daily.   Yes [provider]  ibuprofen (ADVIL) 800 MG tablet Take 1 tablet (800 mg total) by mouth every 8 (eight) hours as needed for moderate pain or cramping. 06/28/20  Yes Banga, Cecilia Worema, DO  Levonorgestrel-Ethinyl Estradiol (AMETHIA) 0.15-0.03 &0.01 MG tablet Take 1 tablet by mouth daily. 10/24/19  Yes [provider]  Multiple Vitamins-Minerals (MULTIVITAMIN ADULTS PO) Take by mouth daily.  Yes [provider]  EPINEPHrine (EPIPEN 2-PAK) 0.3 mg/0.3 mL IJ SOAJ injection Inject 0.3 mg into the muscle once as needed for up to 1 dose (for severe allergic reaction). CAll 911 immediately if you have to use this medicine 02/16/20   Roxy Horseman, PA-C  simethicone (GAS-X) 80 MG chewable tablet Chew 1 tablet (80 mg total) by mouth every 6 (six) hours as needed for flatulence. 06/28/20   Edwinna Areola, DO    Allergies    Septra [sulfamethoxazole-trimethoprim]  Review of Systems   Review of Systems  Gastrointestinal:  Positive for abdominal  pain.  All other systems reviewed and are negative.  Physical Exam Updated Vital Signs BP (!) 150/75 (BP Location: Right Arm)    Pulse (!) 104    Temp 98.5 F (36.9 C) (Oral)    Resp 20    Ht 5\' 3"  (1.6 m)    Wt 72.6 kg    LMP 01/08/2021 (Exact Date)    SpO2 100%    BMI 28.34 kg/m   Physical Exam Vitals and nursing note reviewed.  Constitutional:      General: She is not in acute distress.    Appearance: She is well-developed. She is not diaphoretic.  HENT:     Head: Normocephalic and atraumatic.  Cardiovascular:     Rate and Rhythm: Normal rate and regular rhythm.     Heart sounds: No murmur heard.   No friction rub. No gallop.  Pulmonary:     Effort: Pulmonary effort is normal. No respiratory distress.     Breath sounds: Normal breath sounds. No wheezing.  Abdominal:     General: Bowel sounds are normal. There is no distension.     Palpations: Abdomen is soft.     Tenderness: There is abdominal tenderness in the suprapubic area. There is no right CVA tenderness, left CVA tenderness or guarding.  Musculoskeletal:        General: Normal range of motion.     Cervical back: Normal range of motion and neck supple.  Skin:    General: Skin is warm and dry.  Neurological:     General: No focal deficit present.     Mental Status: She is alert and oriented to person, place, and time.    ED Results / Procedures / Treatments   Labs (all labs ordered are listed, but only abnormal results are displayed) Labs Reviewed  RESP PANEL BY RT-PCR (FLU A&B, COVID) [id number]  COMPREHENSIVE METABOLIC PANEL  CBC WITH DIFFERENTIAL/PLATELET  LIPASE, BLOOD  URINALYSIS, ROUTINE W REFLEX MICROSCOPIC  PREGNANCY, URINE    EKG None  Radiology No results found.  Procedures Procedures   Medications Ordered in ED Medications  morphine 4 MG/ML injection 4 mg (has no administration in time range)  sodium chloride 0.9 % bolus 1,000 mL (has no administration in time range)  ondansetron (ZOFRAN)  injection 4 mg (has no administration in time range)    ED Course  I have reviewed the triage vital signs and the nursing notes.  Pertinent labs & imaging results that were available during my care of the patient were reviewed by me and considered in my medical decision making (see chart for details).    MDM Rules/Calculators/A&P  Patient presenting with lower abdominal pain.  Laboratory studies have been obtained and CT scan ordered.  Care will be signed out to oncoming provider at shift change who will obtain the results of the studies and determine the final disposition.  Final Clinical Impression(s) /  ED Diagnoses Final diagnoses:  None    Rx / DC Orders ED Discharge Orders     None        Geoffery Lyons, MD 01/11/21 [phone]

## 2021-01-10 NOTE — Discharge Instructions (Signed)
Your potassium level was low today at 2.9.  We gave you some IV potassium but it is important you take your oral potassium as prescribed.  Your doctor or gynecologist recheck your potassium levels in 2-4 weeks.  Low potassium levels can slow down your bowels and cause issues with your bowels.  Your CT scan showed that you have a fecal impaction, large amount of stool in your rectal vault and colon.  This is likely causing the pressure you are feeling in the discomfort.  Therefore you will need a bowel cleanout.  I would recommend that you fill and begin taking Dulcolax, 1 pill by mouth as well as 1 rectal suppository at the same time.  Try to keep the suppository in for at least 30 to 60 minutes before sitting on the toilet.  This should produce a large bowel movement.  If you are still not passing a regular bowel movement, you should then move onto a Fleet enema, again trying to keep the fluid in for 30 + minutes before sitting on the toilet.

## 2021-01-10 NOTE — ED Triage Notes (Addendum)
Headache X 3 days, abd pain going to rear(anus). NV X 1 hour.
